# Patient Record
Sex: Male | Born: 1945 | Race: White | Hispanic: No | Marital: Married
Health system: Southern US, Community
[De-identification: ages and names within clinical notes are randomized; demographics above are authoritative.]

## PROBLEM LIST (undated history)

## (undated) DIAGNOSIS — J449 Chronic obstructive pulmonary disease, unspecified: Secondary | ICD-10-CM

## (undated) DIAGNOSIS — J95851 Ventilator associated pneumonia: Secondary | ICD-10-CM

## (undated) DIAGNOSIS — I469 Cardiac arrest, cause unspecified: Secondary | ICD-10-CM

## (undated) DIAGNOSIS — J9621 Acute and chronic respiratory failure with hypoxia: Secondary | ICD-10-CM

## (undated) DIAGNOSIS — I63512 Cerebral infarction due to unspecified occlusion or stenosis of left middle cerebral artery: Secondary | ICD-10-CM

---

## 2019-05-19 ENCOUNTER — Inpatient Hospital Stay
Admission: EM | Admit: 2019-05-19 | Discharge: 2019-07-14 | Disposition: A | Discharge status: Skilled Nursing Facility | Payer: Medicare Other | Source: Other Acute Inpatient Hospital | Attending: Internal Medicine | Admitting: Internal Medicine

## 2019-05-19 ENCOUNTER — Other Ambulatory Visit (HOSPITAL_COMMUNITY): Payer: Medicare Other

## 2019-05-19 DIAGNOSIS — J690 Pneumonitis due to inhalation of food and vomit: Secondary | ICD-10-CM

## 2019-05-19 DIAGNOSIS — R0602 Shortness of breath: Secondary | ICD-10-CM

## 2019-05-19 DIAGNOSIS — J189 Pneumonia, unspecified organism: Secondary | ICD-10-CM

## 2019-05-19 DIAGNOSIS — Z9689 Presence of other specified functional implants: Secondary | ICD-10-CM

## 2019-05-19 DIAGNOSIS — J9621 Acute and chronic respiratory failure with hypoxia: Secondary | ICD-10-CM | POA: Diagnosis present

## 2019-05-19 DIAGNOSIS — J439 Emphysema, unspecified: Secondary | ICD-10-CM

## 2019-05-19 DIAGNOSIS — J449 Chronic obstructive pulmonary disease, unspecified: Secondary | ICD-10-CM | POA: Diagnosis present

## 2019-05-19 DIAGNOSIS — J95851 Ventilator associated pneumonia: Secondary | ICD-10-CM | POA: Diagnosis present

## 2019-05-19 DIAGNOSIS — J9690 Respiratory failure, unspecified, unspecified whether with hypoxia or hypercapnia: Secondary | ICD-10-CM

## 2019-05-19 DIAGNOSIS — I63512 Cerebral infarction due to unspecified occlusion or stenosis of left middle cerebral artery: Secondary | ICD-10-CM | POA: Diagnosis present

## 2019-05-19 DIAGNOSIS — J869 Pyothorax without fistula: Secondary | ICD-10-CM

## 2019-05-19 DIAGNOSIS — J939 Pneumothorax, unspecified: Secondary | ICD-10-CM

## 2019-05-19 DIAGNOSIS — J90 Pleural effusion, not elsewhere classified: Secondary | ICD-10-CM

## 2019-05-19 DIAGNOSIS — I469 Cardiac arrest, cause unspecified: Secondary | ICD-10-CM | POA: Diagnosis present

## 2019-05-19 DIAGNOSIS — Z931 Gastrostomy status: Secondary | ICD-10-CM

## 2019-05-19 HISTORY — DX: Cardiac arrest, cause unspecified: I46.9

## 2019-05-19 HISTORY — DX: Acute and chronic respiratory failure with hypoxia: J96.21

## 2019-05-19 HISTORY — DX: Cerebral infarction due to unspecified occlusion or stenosis of left middle cerebral artery: I63.512

## 2019-05-19 HISTORY — DX: Ventilator associated pneumonia: J95.851

## 2019-05-19 HISTORY — DX: Chronic obstructive pulmonary disease, unspecified: J44.9

## 2019-05-20 ENCOUNTER — Other Ambulatory Visit (HOSPITAL_COMMUNITY): Payer: Medicare Other

## 2019-05-20 DIAGNOSIS — I63512 Cerebral infarction due to unspecified occlusion or stenosis of left middle cerebral artery: Secondary | ICD-10-CM | POA: Diagnosis not present

## 2019-05-20 DIAGNOSIS — J9621 Acute and chronic respiratory failure with hypoxia: Secondary | ICD-10-CM | POA: Diagnosis not present

## 2019-05-20 DIAGNOSIS — J95851 Ventilator associated pneumonia: Secondary | ICD-10-CM

## 2019-05-20 DIAGNOSIS — J449 Chronic obstructive pulmonary disease, unspecified: Secondary | ICD-10-CM | POA: Diagnosis not present

## 2019-05-20 DIAGNOSIS — I469 Cardiac arrest, cause unspecified: Secondary | ICD-10-CM

## 2019-05-20 LAB — BASIC METABOLIC PANEL
Anion gap: 9 (ref 5–15)
BUN: 25 mg/dL — ABNORMAL HIGH (ref 8–23)
CO2: 33 mmol/L — ABNORMAL HIGH (ref 22–32)
Calcium: 9.3 mg/dL (ref 8.9–10.3)
Chloride: 102 mmol/L (ref 98–111)
Creatinine, Ser: 0.62 mg/dL (ref 0.61–1.24)
GFR calc Af Amer: >60 mL/min (ref >60)
GFR calc non Af Amer: >60 mL/min (ref >60)
Glucose, Bld: 128 mg/dL — ABNORMAL HIGH (ref 70–99)
Potassium: 5.1 mmol/L (ref 3.5–5.1)
Sodium: 144 mmol/L (ref 135–145)

## 2019-05-20 LAB — BLOOD GAS, ARTERIAL
Acid-Base Excess: 10.4 mmol/L — ABNORMAL HIGH (ref 0.0–2.0)
Bicarbonate: 34.8 mmol/L — ABNORMAL HIGH (ref 20.0–28.0)
FIO2: 50
O2 Saturation: 97.3 %
Patient temperature: 37
pCO2 arterial: 50 mmHg — ABNORMAL HIGH (ref 32.0–48.0)
pH, Arterial: 7.457 — ABNORMAL HIGH (ref 7.350–7.450)
pO2, Arterial: 91.3 mmHg (ref 83.0–108.0)

## 2019-05-20 LAB — CBC
HCT: 27.2 % — ABNORMAL LOW (ref 39.0–52.0)
Hemoglobin: 8.2 g/dL — ABNORMAL LOW (ref 13.0–17.0)
MCH: 28.3 pg (ref 26.0–34.0)
MCHC: 30.1 g/dL (ref 30.0–36.0)
MCV: 93.8 fL (ref 80.0–100.0)
Platelets: 466 10*3/uL — ABNORMAL HIGH (ref 150–400)
RBC: 2.9 MIL/uL — ABNORMAL LOW (ref 4.22–5.81)
RDW: 16.6 % — ABNORMAL HIGH (ref 11.5–15.5)
WBC: 14.7 10*3/uL — ABNORMAL HIGH (ref 4.0–10.5)
nRBC: 0.1 % (ref 0.0–0.2)

## 2019-05-20 MED ORDER — QUETIAPINE FUMARATE 25 MG PO TABS
100.00 | ORAL_TABLET | ORAL | Status: DC
Start: 2019-05-20 — End: 2019-05-20

## 2019-05-20 MED ORDER — LORAZEPAM 2 MG/ML IJ SOLN
0.50 | INTRAMUSCULAR | Status: DC
Start: ? — End: 2019-05-20

## 2019-05-20 MED ORDER — INSULIN LISPRO 100 UNIT/ML ~~LOC~~ SOLN
1.00 | SUBCUTANEOUS | Status: DC
Start: ? — End: 2019-05-20

## 2019-05-20 MED ORDER — DEXTROSE 10 % IV SOLN
50.00 | INTRAVENOUS | Status: DC
Start: ? — End: 2019-05-20

## 2019-05-20 MED ORDER — METOCLOPRAMIDE HCL 5 MG/ML IJ SOLN
5.00 | INTRAMUSCULAR | Status: DC
Start: ? — End: 2019-05-20

## 2019-05-20 MED ORDER — IOHEXOL 300 MG/ML  SOLN
50.0000 mL | Freq: Once | INTRAMUSCULAR | Status: AC | PRN
  Administered 2019-05-20: 50 mL

## 2019-05-20 MED ORDER — GENERIC EXTERNAL MEDICATION
1.00 | Status: DC
Start: 2019-05-20 — End: 2019-05-20

## 2019-05-20 MED ORDER — METOPROLOL TARTRATE 5 MG/5ML IV SOLN
2.50 | INTRAVENOUS | Status: DC
Start: ? — End: 2019-05-20

## 2019-05-20 MED ORDER — SODIUM CHLORIDE 0.9 % IV SOLN
10.00 | INTRAVENOUS | Status: DC
Start: ? — End: 2019-05-20

## 2019-05-20 MED ORDER — ACETAMINOPHEN 160 MG/5ML PO SUSP
650.00 | ORAL | Status: DC
Start: ? — End: 2019-05-20

## 2019-05-20 MED ORDER — CALCIUM CARBONATE 1250 (500 CA) MG PO CHEW
500.00 | CHEWABLE_TABLET | ORAL | Status: DC
Start: ? — End: 2019-05-20

## 2019-05-20 MED ORDER — ONDANSETRON HCL 4 MG/2ML IJ SOLN
4.00 | INTRAMUSCULAR | Status: DC
Start: ? — End: 2019-05-20

## 2019-05-20 MED ORDER — IPRATROPIUM-ALBUTEROL 0.5-2.5 (3) MG/3ML IN SOLN
3.00 | RESPIRATORY_TRACT | Status: DC
Start: ? — End: 2019-05-20

## 2019-05-20 MED ORDER — FENTANYL CITRATE (PF) 2500 MCG/50ML IJ SOLN
50.00 | INTRAMUSCULAR | Status: DC
Start: ? — End: 2019-05-20

## 2019-05-20 MED ORDER — CLOTRIMAZOLE 1 % EX CREA
TOPICAL_CREAM | CUTANEOUS | Status: DC
Start: 2019-05-20 — End: 2019-05-20

## 2019-05-20 MED ORDER — GENERIC EXTERNAL MEDICATION
Status: DC
Start: ? — End: 2019-05-20

## 2019-05-20 MED ORDER — INSULIN GLARGINE 100 UNIT/ML ~~LOC~~ SOLN
1.00 | SUBCUTANEOUS | Status: DC
Start: 2019-05-20 — End: 2019-05-20

## 2019-05-20 MED ORDER — OXYCODONE HCL 5 MG/5ML PO SOLN
10.00 | ORAL | Status: DC
Start: 2019-05-20 — End: 2019-05-20

## 2019-05-20 MED ORDER — DIPHENOXYLATE-ATROPINE 2.5-0.025 MG/5ML PO LIQD
5.00 | ORAL | Status: DC
Start: ? — End: 2019-05-20

## 2019-05-20 MED ORDER — FENTANYL CITRATE (PF) 2500 MCG/50ML IJ SOLN
25.00 | INTRAMUSCULAR | Status: DC
Start: ? — End: 2019-05-20

## 2019-05-20 MED ORDER — QUETIAPINE FUMARATE 25 MG PO TABS
50.00 | ORAL_TABLET | ORAL | Status: DC
Start: 2019-05-20 — End: 2019-05-20

## 2019-05-20 MED ORDER — MIDODRINE HCL 5 MG PO TABS
5.00 | ORAL_TABLET | ORAL | Status: DC
Start: 2019-05-20 — End: 2019-05-20

## 2019-05-20 MED ORDER — INSULIN LISPRO 100 UNIT/ML ~~LOC~~ SOLN
1.00 | SUBCUTANEOUS | Status: DC
Start: 2019-05-20 — End: 2019-05-20

## 2019-05-20 MED ORDER — ENOXAPARIN SODIUM 80 MG/0.8ML ~~LOC~~ SOLN
80.00 | SUBCUTANEOUS | Status: DC
Start: 2019-05-20 — End: 2019-05-20

## 2019-05-20 MED ORDER — ASPIRIN 81 MG PO CHEW
81.00 | CHEWABLE_TABLET | ORAL | Status: DC
Start: 2019-05-20 — End: 2019-05-20

## 2019-05-20 NOTE — Consult Note (Signed)
Pulmonary Critical Care Medicine [facility] [place]  PULMONARY SERVICE  Date of Service: 05/20/2019  PULMONARY CRITICAL CARE CONSULT   DIOR STEPTER  MWU:[id number]  DOB: [birth year]   DOA: 05/19/2019  Referring Physician: Carron Curie, MD  HPI: Garrett Zimmerman is a 74 y.o. male seen for follow up of Acute on Chronic Respiratory Failure.  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  Patient presented to the hospital because of an acute cardiac arrest.  Patient has been having some difficulty with his breathing waking up at nighttime frequent use of his nebulizers.  The primary care physician had seen and treated for a acute exacerbation of COPD patient apparently woke up 4 AM on the day of admission he used his nebulizer machine however called told his family to call for EMS.  When EMS arrived patient was asystolic CPR was started patient was intubated and apparently in route to the ER woke up.  Hospital course was associated with diagnosis of ventilator associated pneumonia/stroke acutely the left hemisphere and right side.  Review of Systems:  ROS performed and is unremarkable other than noted above.   Past Medical History:  Diagnosis Date  . AAA (abdominal aortic aneurysm) without rupture (*) 10/10/2017  4.4 cm; stable since 10/2016  . Acute blood loss anemia 07/03/2018  . Acute encephalopathy 09/04/2017  . Acute exacerbation of chronic obstructive pulmonary disease (*) 06/11/2017  . Acute on chronic respiratory failure with hypoxia and hypercapnia (*) 06/18/2016  . Anxiety 03/10/2016  . Arthritis  . Bilateral renal cysts  . CAD (coronary artery disease) 1998  "posterior angioplasty". STENTS 2016 ALSO. DR. Mayford Knife - IN HOSPITAL 10/21/2016 - Oct 24, 2016 - HAD PASSED OUT. CARDIAC CATH DONE 10/11/16.  . Cancer (*)  . CAP (community acquired pneumonia) 03/10/2016  . Cardiac pacemaker 07/15/2018  . COPD  (chronic obstructive pulmonary disease) (*)  . COPD (chronic obstructive pulmonary disease) with emphysema (*)  SEVERE per spirometry 08/2013 (FVC 68%, FEV1 26%, FEV1/FVC 38%)  . Coronary artery disease  . Coronary artery disease involving native coronary artery of native heart without angina pectoris 06/11/2017  . Diabetes mellitus with peripheral vascular disease (*) 11/26/2017  . Dupuytren's contracture of both hands 07/31/2012  . Full dentures  . GI bleed 07/03/2018  . H/O ST elevation myocardial infarction 09/14/2014  . Heart attack (*) 1998  2016  . Hematuria  . Hyperlipidemia  . Hyperlipidemia associated with type 2 diabetes mellitus (*) 03/10/2016  . Hypertension  UNDER CONTROL PER PT. 10/16/16  . Hypertension associated with diabetes (*) 03/10/2016  . Kidney stone  PASSED X 1  . Nicotine dependence, cigarettes, with other nicotine-induced disorders 07/25/2017  . OSA (obstructive sleep apnea) 06/17/2016  Intolerant of CPAP  . Osteoarthritis of multiple joints 03/10/2016  . Oxygen dependent- 2 L/[place]  . Panic attacks  X 1  . Peripheral vascular disease (*)  . Phimosis  . Pulmonary emphysema (*) 03/10/2016  . S/P angioplasty with DESx2 to circ - 09/07/14 for STEMI at [facility] 09/14/2014  . S/P drug eluting coronary stent placement 03/10/2016  . S/P primary angioplasty with coronary stent 09/07/2014  . Sleep apnea  NO CPAP. ELEVATES HOB  . ST elevation myocardial infarction (STEMI) involving other coronary artery of inferior wall (*) 09/07/2014  . Tobacco abuse    Past Surgical History:   Past Surgical History:  Procedure Laterality Date  . Cardiac catheterization  LAST  ONE 10/11/16 - CLEAR PER PT 10/16/16  . Cholecystectomy  . [facility]  . Colonoscopy [facility]/2018  [facility]  . Coronary angioplasty with stent placement 1998  "posterior". ANOTHER IN 2016.  Marland Kitchen Other note  LEFT CAROTID ARTERY "CHECKED" FOR BLOCKAGE. - NO RESULT YET.  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 Upper  gastrointestinal endoscopy 06/2018  [facility]    Social History:   Social History   Tobacco Use  . Smoking status: Current Every Day Smoker  Packs/day: 1.00  Years: 50.00  Pack years: 50.00  Types: Cigarettes  Start date: 39  . Smokeless tobacco: Former Engineer, water Use Topics  . Alcohol use: Not Currently  . Drug use: Never    Family History:   Family History  Problem Relation Age of Onset  . Cancer Mother  . Heart disease Father  heart attack  . Cirrhosis Brother  . Alcohol abuse Brother  . Cancer Sister  Breast Cancer     Medications: Reviewed on Rounds  Physical Exam:  Vitals: Temperature 98.3 pulse 113 respiratory rate 30 blood pressure is 140/80 saturations 97%  Ventilator Settings on assist control FiO2 35% tidal volume 500 PEEP 5  . General: Comfortable at this time . Eyes: Grossly normal lids, irises & conjunctiva . ENT: grossly tongue is normal . Neck: no obvious mass . Cardiovascular: S1-S2 normal no gallop or rub . Respiratory: No rhonchi no rales are noted at this time . Abdomen: Soft and nontender . Skin: no rash seen on limited exam . Musculoskeletal: not rigid . Psychiatric:unable to assess . Neurologic: no seizure no involuntary movements         Labs on Admission:  Basic Metabolic Panel: Recent Labs  Lab 05/20/19 0516  NA 144  K 5.1  CL 102  CO2 33*  GLUCOSE 128*  BUN 25*  CREATININE 0.62  CALCIUM 9.3    Recent Labs  Lab 05/20/19 0020  PHART 7.457*  PCO2ART 50.0*  PO2ART 91.3  HCO3 34.8*  O2SAT 97.3    Liver Function Tests: No results for input(s): AST, ALT, ALKPHOS, BILITOT, PROT, ALBUMIN in the last 168 hours. No results for input(s): LIPASE, AMYLASE in the last 168 hours. No results for input(s): AMMONIA in the last 168 hours.  CBC: Recent Labs  Lab 05/20/19 0516  WBC 14.7*  HGB 8.2*  HCT 27.2*  MCV 93.8  PLT 466*    Cardiac Enzymes: No results for input(s): CKTOTAL, CKMB, CKMBINDEX, TROPONINI  in the last 168 hours.  BNP (last 3 results) No results for input(s): BNP in the last 8760 hours.  ProBNP (last 3 results) No results for input(s): PROBNP in the last 8760 hours.   Radiological Exams on Admission: DG ABDOMEN PEG TUBE LOCATION  Result Date: 05/20/2019 CLINICAL DATA:  Gastrostomy tube check EXAM: ABDOMEN - 1 VIEW COMPARISON:  None. FINDINGS: 50 mL of Omnipaque was administered through the gastrostomy tube. Contrast material outlines gastric folds. Nonobstructive bowel gas pattern. IMPRESSION: Gastrostomy tube in the stomach. Electronically Signed   By: Deatra Robinson M.D.   On: 05/20/2019 00:55    Assessment/Plan Active Problems:   Acute on chronic respiratory failure with hypoxia (HCC)   Ventilator associated pneumonia (HCC)   COPD, severe (HCC)   Left acute arterial ischemic stroke, MCA (middle cerebral artery) (HCC)   Cardiac arrest (HCC)   1. Acute on chronic respiratory failure with hypoxia spoke with respiratory therapy during rounds we will plan on doing a  RSB I and checking mechanics and try to place on pressure support per wean protocol.  Right now the ABG looked okay in fact patient was more on the alkalotic side. 2. Ventilator associated pneumonia treated patient was diagnosed with MDR E. coli x2 we will continue to follow-up radiologically. 3. Severe terminal end-stage COPD patient been on nebulizers at the other facility [facility] as needed.  We will continue with supportive care nebulizer management. 4. Acute stroke sequela patient will be continue with therapy as tolerated we will continue to monitor closely. 5. Cardiac arrest patient suffered a cardiac arrest at home and successful resuscitation now on vent with trach  I have personally seen and evaluated the patient, evaluated laboratory and imaging results, formulated the assessment and plan and placed orders. The Patient requires high complexity decision making with multiple systems involvement.  Case was  discussed on Rounds with the Respiratory Therapy Director and the Respiratory staff Time Spent 95minutes  Nicholes Hibler A Alazae Crymes, MD [facility] Pulmonary Critical Care Medicine Sleep Medicine

## 2019-05-21 ENCOUNTER — Other Ambulatory Visit (HOSPITAL_COMMUNITY): Payer: Medicare Other

## 2019-05-21 ENCOUNTER — Encounter: Payer: Self-pay | Admitting: Internal Medicine

## 2019-05-21 DIAGNOSIS — I469 Cardiac arrest, cause unspecified: Secondary | ICD-10-CM | POA: Diagnosis present

## 2019-05-21 DIAGNOSIS — I63512 Cerebral infarction due to unspecified occlusion or stenosis of left middle cerebral artery: Secondary | ICD-10-CM | POA: Diagnosis present

## 2019-05-21 DIAGNOSIS — J95851 Ventilator associated pneumonia: Secondary | ICD-10-CM | POA: Diagnosis present

## 2019-05-21 DIAGNOSIS — J9621 Acute and chronic respiratory failure with hypoxia: Secondary | ICD-10-CM | POA: Diagnosis not present

## 2019-05-21 DIAGNOSIS — J449 Chronic obstructive pulmonary disease, unspecified: Secondary | ICD-10-CM | POA: Diagnosis present

## 2019-05-21 MED ORDER — GENERIC EXTERNAL MEDICATION
Status: DC
Start: ? — End: 2019-05-21

## 2019-05-21 NOTE — Progress Notes (Signed)
Pulmonary Critical Care Medicine [place]   PULMONARY CRITICAL CARE SERVICE  PROGRESS NOTE  Date of Service: 05/21/2019  Garrett Zimmerman  MMN:[id number]  DOB: [birth year]   DOA: 05/19/2019  Referring Physician: Merton Border, MD  HPI: Garrett Zimmerman is a 74 y.o. male seen for follow up of Acute on Chronic Respiratory Failure.  Patient at this time is on full support on the ventilator on assist control mode.  Family is concerned about patient not being on nebulizers.  Patient was on as needed nebulizers at the other facility.  It appears the patient was not on any maintenance nebulizers.  And I did recommend that we proceed to start him on maintenance nebulizers.  Medications: Reviewed on Rounds  Physical Exam:  Vitals: Temperature 98.4 pulse 94 respiratory rate 35 blood pressure is 152/81 saturations 94%  Ventilator Settings on assist control FiO2 30% tidal volume 500 PEEP 5  . General: Comfortable at this time . Eyes: Grossly normal lids, irises & conjunctiva . ENT: grossly tongue is normal . Neck: no obvious mass . Cardiovascular: S1 S2 normal no gallop . Respiratory: Coarse breath sounds with few scattered rhonchi . Abdomen: soft . Skin: no rash seen on limited exam . Musculoskeletal: not rigid . Psychiatric:unable to assess . Neurologic: no seizure no involuntary movements         Lab Data:   Basic Metabolic Panel: Recent Labs  Lab 05/20/19 0516  NA 144  K 5.1  CL 102  CO2 33*  GLUCOSE 128*  BUN 25*  CREATININE 0.62  CALCIUM 9.3    ABG: Recent Labs  Lab 05/20/19 0020  PHART 7.457*  PCO2ART 50.0*  PO2ART 91.3  HCO3 34.8*  O2SAT 97.3    Liver Function Tests: No results for input(s): AST, ALT, ALKPHOS, BILITOT, PROT, ALBUMIN in the last 168 hours. No results for input(s): LIPASE, AMYLASE in the last 168 hours. No results for input(s): AMMONIA in the last 168 hours.  CBC: Recent Labs  Lab 05/20/19 0516  WBC 14.7*  HGB 8.2*   HCT 27.2*  MCV 93.8  PLT 466*    Cardiac Enzymes: No results for input(s): CKTOTAL, CKMB, CKMBINDEX, TROPONINI in the last 168 hours.  BNP (last 3 results) No results for input(s): BNP in the last 8760 hours.  ProBNP (last 3 results) No results for input(s): PROBNP in the last 8760 hours.  Radiological Exams: DG ABDOMEN PEG TUBE LOCATION  Result Date: 05/20/2019 CLINICAL DATA:  Gastrostomy tube check EXAM: ABDOMEN - 1 VIEW COMPARISON:  None. FINDINGS: 50 mL of Omnipaque was administered through the gastrostomy tube. Contrast material outlines gastric folds. Nonobstructive bowel gas pattern. IMPRESSION: Gastrostomy tube in the stomach. Electronically Signed   By: Ulyses Jarred M.D.   On: 05/20/2019 00:55   DG CHEST PORT 1 VIEW  Result Date: 05/20/2019 CLINICAL DATA:  Respiratory failure EXAM: PORTABLE CHEST 1 VIEW COMPARISON:  None. FINDINGS: There is an endotracheal tube with the tip 4.5 cm above the carina. There is mild bilateral interstitial thickening. There is opacity overlying the right lower lung which may reflect right lower lobe pneumonia versus a loculated pleural effusion. There is no pneumothorax. The heart and mediastinal contours are unremarkable. There is a dual lead cardiac pacemaker. There is no acute osseous abnormality. IMPRESSION: 1. Endotracheal tube with the tip 4.5 cm above the carina. 2. Right lower lobe pneumonia versus a loculated pleural effusion. 3. Mild pulmonary vascular congestion. Electronically Signed   By: Kathreen Devoid  On: 05/20/2019 11:22    Assessment/Plan Active Problems:   Acute on chronic respiratory failure with hypoxia (HCC)   Ventilator associated pneumonia (HCC)   COPD, severe (HCC)   Left acute arterial ischemic stroke, MCA (middle cerebral artery) (HCC)   Cardiac arrest (HCC)   1. Acute on chronic respiratory failure hypoxia plan is going to continue with full support on the ventilator.  Continue to check the RSB I.  Patient had a  chest x-ray done which shows lower lobe pneumonia versus effusion.  Consider a CT of the chest. 2. Severe COPD we will place on Pulmicort as well as formoterol nebulizers and Symbicort is not available. 3. Cardiac arrest rhythm is stable at this time we will continue to follow 4. Acute stroke therapy as tolerated   I have personally seen and evaluated the patient, evaluated laboratory and imaging results, formulated the assessment and plan and placed orders. The Patient requires high complexity decision making with multiple systems involvement.  Rounds were done with the Respiratory Therapy Director and Staff therapists and discussed with nursing staff also.  [name], MD [facility] Pulmonary Critical Care Medicine Sleep Medicine

## 2019-05-22 ENCOUNTER — Other Ambulatory Visit (HOSPITAL_COMMUNITY): Payer: Medicare Other

## 2019-05-22 DIAGNOSIS — J449 Chronic obstructive pulmonary disease, unspecified: Secondary | ICD-10-CM | POA: Diagnosis not present

## 2019-05-22 DIAGNOSIS — I469 Cardiac arrest, cause unspecified: Secondary | ICD-10-CM | POA: Diagnosis not present

## 2019-05-22 DIAGNOSIS — J9621 Acute and chronic respiratory failure with hypoxia: Secondary | ICD-10-CM | POA: Diagnosis not present

## 2019-05-22 DIAGNOSIS — I63512 Cerebral infarction due to unspecified occlusion or stenosis of left middle cerebral artery: Secondary | ICD-10-CM | POA: Diagnosis not present

## 2019-05-22 LAB — URINALYSIS, ROUTINE W REFLEX MICROSCOPIC
Bilirubin Urine: NEGATIVE
Glucose, UA: NEGATIVE mg/dL
Hgb urine dipstick: NEGATIVE
Ketones, ur: NEGATIVE mg/dL
Nitrite: NEGATIVE
Protein, ur: NEGATIVE mg/dL
Specific Gravity, Urine: 1.015 (ref 1.005–1.030)
pH: 8 (ref 5.0–8.0)

## 2019-05-22 LAB — CBC
HCT: 28.3 % — ABNORMAL LOW (ref 39.0–52.0)
Hemoglobin: 8.6 g/dL — ABNORMAL LOW (ref 13.0–17.0)
MCH: 28.3 pg (ref 26.0–34.0)
MCHC: 30.4 g/dL (ref 30.0–36.0)
MCV: 93.1 fL (ref 80.0–100.0)
Platelets: 464 10*3/uL — ABNORMAL HIGH (ref 150–400)
RBC: 3.04 MIL/uL — ABNORMAL LOW (ref 4.22–5.81)
RDW: 17.2 % — ABNORMAL HIGH (ref 11.5–15.5)
WBC: 22.2 10*3/uL — ABNORMAL HIGH (ref 4.0–10.5)
nRBC: 0 % (ref 0.0–0.2)

## 2019-05-22 LAB — BASIC METABOLIC PANEL
Anion gap: 11 (ref 5–15)
BUN: 19 mg/dL (ref 8–23)
CO2: 30 mmol/L (ref 22–32)
Calcium: 8.9 mg/dL (ref 8.9–10.3)
Chloride: 101 mmol/L (ref 98–111)
Creatinine, Ser: 0.6 mg/dL — ABNORMAL LOW (ref 0.61–1.24)
GFR calc Af Amer: >60 mL/min (ref >60)
GFR calc non Af Amer: >60 mL/min (ref >60)
Glucose, Bld: 125 mg/dL — ABNORMAL HIGH (ref 70–99)
Potassium: 4.1 mmol/L (ref 3.5–5.1)
Sodium: 142 mmol/L (ref 135–145)

## 2019-05-22 MED ORDER — GENERIC EXTERNAL MEDICATION
Status: DC
Start: ? — End: 2019-05-22

## 2019-05-22 MED ORDER — IOHEXOL 300 MG/ML  SOLN
75.0000 mL | Freq: Once | INTRAMUSCULAR | Status: AC | PRN
  Administered 2019-05-22: 17:00:00 75 mL via INTRAVENOUS

## 2019-05-22 NOTE — Consult Note (Signed)
Infectious Disease Consultation   Garrett Zimmerman  IHK:[id number]  DOB: [birth year]  DOA: 05/19/2019  PCP: Patient, No Pcp Per   Requesting physician: Dr.Brown  Reason for consultation: Antibiotic recommendations   History of Present Illness: Garrett Zimmerman is an 74 y.o. male with history of stroke, anxiety, COPD, coronary artery disease, abdominal aortic aneurysm, hypertension, diabetes mellitus.  He was initially brought to the hospital after having cardiac arrest.  He was admitted to [facility].  Prior to his cardiac arrest he was having worsening shortness of breath despite using his nebulizer machine.  He saw his primary care physician outpatient and was given a prescription for Levaquin and prednisone.  On the day of presentation to the acute facility he woke up at 4 AM used his nebulizer machine and called his family for help.  When EMS arrived he was asystolic.  CPR was initiated.  He was intubated in the field.  Per records patient reportedly was more awake upon arrival to the ER and was noted to have unilateral weakness.  Code stroke was called.  Imaging showed a large right-sided infarct with petechial hemorrhage.  Neurology was consulted and his anticoagulation was held.  He was unable to be weaned off the vent and eventually had trach and PEG placed on April 15, 2019.  During his hospital stay he was found to have right upper extremity DVT and was started on heparin drip and later transitioned to low molecular weight heparin.  He also had worsening fevers and leukocytosis.  Cultures showed E. coli pneumonia which was ESBL.  He was started on meropenem and vancomycin.  He was later narrowed down to ertapenem.  Due to his complex medical problems he was transferred to [facility].   Review of Systems:  He has a trach, on vent.  Nonverbal, unresponsive.  Unable to obtain review of systems at this time.  Past Medical History: AAA (abdominal aortic  aneurysm) without rupture (*) 10/10/2017  4.4 cm; stable since 10/2016  . Acute blood loss anemia 07/03/2018  . Acute encephalopathy 09/04/2017  . Acute exacerbation of chronic obstructive pulmonary disease (*) 06/11/2017  . Acute on chronic respiratory failure with hypoxia and hypercapnia (*) 06/18/2016  . Anxiety 03/10/2016  . Arthritis  . Bilateral renal cysts  . CAD (coronary artery disease) 1998  "posterior angioplasty". STENTS 2016 ALSO. DR. Mayford Knife - IN HOSPITAL November 04, 2016 - 2016-11-07 - HAD PASSED OUT. CARDIAC CATH DONE 10/11/16.  . Cancer (*)  . CAP (community acquired pneumonia) 03/10/2016  . Cardiac pacemaker 07/15/2018  . COPD (chronic obstructive pulmonary disease) with emphysema (*)  SEVERE per spirometry 08/2013 (FVC 68%, FEV1 26%, FEV1/FVC 38%)  . Coronary artery disease  . Coronary artery disease involving native coronary artery of native heart without angina pectoris 06/11/2017  . Diabetes mellitus with peripheral vascular disease (*) 11/26/2017  . Dupuytren's contracture of both hands 07/31/2012  . Full dentures  . GI bleed 07/03/2018  . H/O ST elevation myocardial infarction 09/14/2014  . Heart attack (*) 1998  2016  . Hematuria  . Hyperlipidemia  . Hyperlipidemia associated with type 2 diabetes mellitus (*) 03/10/2016  . Hypertension  UNDER CONTROL PER PT. 10/16/16  . Hypertension associated with diabetes (*) 03/10/2016  . Kidney stone  PASSED X 1  . Nicotine dependence, cigarettes, with other nicotine-induced disorders 07/25/2017  . OSA (obstructive sleep apnea) 06/17/2016  Intolerant of CPAP  . Osteoarthritis of  multiple joints 03/10/2016  . Oxygen dependent- 2 L/Edie  . Panic attacks  X 1  . Peripheral vascular disease (*)  . Phimosis  . Pulmonary emphysema (*) 03/10/2016  . S/P angioplasty with DESx2 to circ - 09/07/14 for STEMI at [facility] 09/14/2014  . S/P drug eluting coronary stent placement 03/10/2016  . S/P primary angioplasty with coronary stent 09/07/2014  . Sleep apnea  NO CPAP. ELEVATES  HOB  . ST elevation myocardial infarction (STEMI) involving other coronary artery of inferior wall (*) 09/07/2014    Past Surgical History: . Cardiac catheterization  LAST ONE 10/11/16 - CLEAR PER PT 10/16/16  . Cholecystectomy  . [facility]  . [facility]oscopy 06/2018  [facility]  . Coronary angioplasty with stent placement 1998  "posterior". ANOTHER IN 2016.  Marland Kitchen Other note  LEFT CAROTID ARTERY "CHECKED" FOR BLOCKAGE. - NO RESULT YET.  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 Upper gastrointestinal endoscopy 06/2018    Allergies: Crestor, sulfa antibiotics, Spiriva Respimat  Social History: Smoking status: Current Every Day Smoker  Packs/day: 1.00  Years: 50.00  Pack years: 50.00  Types: Cigarettes  Start date: 38  . Smokeless tobacco: Former Engineer, water and Sexual Activity  . Alcohol use: Not Currently  . Drug use: Never    Family History: Cancer Mother  . Heart disease Father  heart attack  . Cirrhosis Brother  . Alcohol abuse Brother  . Cancer Sister    Physical Exam: Vitals: Temperature 99.4, pulse 106, respiratory 28, blood pressure 125/67, oxygen saturation 97% on 50% FiO2 and PEEP of 5. Constitutional: Ill-appearing male, not following any commands at this time Head: Atraumatic, normocephalic Eyes: PERLA, irises appear normal, anicteric sclera,  ENMT: external ears and nose appear normal, Lips appears normal, edentulous, oropharynx mucosa, tongue, normal  Neck: Has trach in place CVS: S1-S2, no murmur Respiratory: Rhonchi, no wheezing Abdomen: Obese, soft, positive bowel sounds Musculoskeletal: No lower extremity edema, has upper extremity edema Neuro: He is not following any commands at this time.  Unable to assess. Psych: Unable to assess at this time Skin: No new rashes  Data reviewed:  I have personally reviewed following labs and imaging studies Labs:  CBC: Recent Labs  Lab 05/20/19 0516 05/22/19 0732  WBC 14.7* 22.2*  HGB 8.2*  8.6*  HCT 27.2* 28.3*  MCV 93.8 93.1  PLT 466* 464*    Basic Metabolic Panel: Recent Labs  Lab 05/20/19 0516 05/22/19 0732  NA 144 142  K 5.1 4.1  CL 102 101  CO2 33* 30  GLUCOSE 128* 125*  BUN 25* 19  CREATININE 0.62 0.60*  CALCIUM 9.3 8.9   GFR CrCl cannot be calculated (Unknown ideal weight.). Liver Function Tests: No results for input(s): AST, ALT, ALKPHOS, BILITOT, PROT, ALBUMIN in the last 168 hours. No results for input(s): LIPASE, AMYLASE in the last 168 hours. No results for input(s): AMMONIA in the last 168 hours. Coagulation profile No results for input(s): INR, PROTIME in the last 168 hours.  Cardiac Enzymes: No results for input(s): CKTOTAL, CKMB, CKMBINDEX, TROPONINI in the last 168 hours. BNP: Invalid input(s): POCBNP CBG: No results for input(s): GLUCAP in the last 168 hours. D-Dimer No results for input(s): DDIMER in the last 72 hours. Hgb A1c No results for input(s): HGBA1C in the last 72 hours. Lipid Profile No results for input(s): CHOL, HDL, LDLCALC, TRIG, CHOLHDL, LDLDIRECT in the last 72 hours. Thyroid function studies No results for input(s): TSH, T4TOTAL, T3FREE,  THYROIDAB in the last 72 hours.  Invalid input(s): FREET3 Anemia work up No results for input(s): VITAMINB12, FOLATE, FERRITIN, TIBC, IRON, RETICCTPCT in the last 72 hours. Urinalysis    Component Value Date/Time   COLORURINE YELLOW 05/22/2019 0629   APPEARANCEUR CLOUDY (A) 05/22/2019 0629   LABSPEC 1.015 05/22/2019 0629   PHURINE 8.0 05/22/2019 0629   GLUCOSEU NEGATIVE 05/22/2019 0629   HGBUR NEGATIVE 05/22/2019 0629   BILIRUBINUR NEGATIVE 05/22/2019 0629   KETONESUR NEGATIVE 05/22/2019 0629   PROTEINUR NEGATIVE 05/22/2019 0629   NITRITE NEGATIVE 05/22/2019 0629   LEUKOCYTESUR SMALL (A) 05/22/2019 0629     Microbiology No results found for this or any previous visit (from the past 240 hour(s)).   Inpatient Medications:   Please see MAR   Radiological Exams on  Admission: DG Chest Port 1 View  Result Date: 05/21/2019 CLINICAL DATA:  Pneumonia EXAM: PORTABLE CHEST 1 VIEW COMPARISON:  05/20/2019 FINDINGS: Cardiac shadow is stable. Pacing device is again seen. Persistent infiltrate is noted in the right base similar to that noted on the prior exam. A portion of this may represent some loculated fluid as well. No bony abnormality is seen. Tracheostomy tube is noted in satisfactory position. IMPRESSION: Stable opacity in the right base likely representing a combination of infiltrate and loculated fluid. Electronically Signed   By: Inez Catalina M.D.   On: 05/21/2019 19:22    Impression/Recommendations Active Problems:   Acute on chronic respiratory failure with hypoxia (HCC)   Ventilator associated pneumonia (HCC)   COPD, severe (HCC)   Left acute arterial ischemic stroke, MCA (middle cerebral artery) ([place])   Cardiac arrest ([place]) Fever/leukocytosis Recent pneumonia with ESBL E. coli E. coli bacteremia Right upper extremity DVT Diabetes mellitus type 2 Dysphagia  Acute on chronic hypoxemic respiratory failure: Patient continues to be vent dependent on 50% FiO2, PEEP of 5.  Likely multifactorial etiology.  He has severe COPD.  He also has ventilator associated pneumonia.  At the outside facility he had pneumonia with respiratory cultures that showed ESBL E. coli.  He was treated with meropenem but later deescalated to Invanz.  However, now having fevers again with worsening leukocytosis.  Therefore switched to IV vancomycin, meropenem.  Do not think he needs a fluconazole at this time.  Chest x-ray showing right-sided infiltrate.  CT chest ordered by the primary team.  Agree with the CT chest to get better evaluation.  Pulmonary also following.  Unfortunately has dysphagia and also high risk for aspiration given the recent stroke which places him at a very high risk for worsening respiratory failure, recurrent pneumonia secondary to aspiration despite being on  antibiotics.  Respiratory cultures also ordered.  We will plan to treat for duration of 1 week pending improvement.  Follow-up on the respiratory cultures and adjust antibiotics accordingly.  Pneumonia: As mentioned above cultures at the outside facility showed ESBL E. coli.  Was on Invanz but now having fevers with worsening leukocytosis.  Also because of his dysphagia high suspicion for ongoing aspiration.  Recommend to switch to IV vancomycin, meropenem.  Follow-up on the respiratory cultures and adjust antibiotics accordingly.  Chest CT being ordered as per the primary team.  Pulmonary also following.  E. coli bacteremia: He was treated with antibiotics already at the outside facility.  Per reports had negative blood cultures.  However, if he starts having fevers with temperature greater than 101 would recommend to send for repeat pancultures.  Currently on treatment with IV vancomycin, meropenem.  Fever/leukocytosis:  Likely secondary to the pneumonia.  Antibiotics and plan as mentioned above.  Again, as mentioned above he has dysphagia and high suspicion for ongoing aspiration which unfortunately places him at very high risk for recurrent and worsening pneumonia, worsening fever or worsening leukocytosis despite being on antibiotics.  Continue to monitor counts.  Left acute arterial ischemic stroke: Continue risk factor modification, medications per the primary team.  He is also status post recent cardiac arrest.  Continue medications per primary team.  Right upper extremity DVT: Continue management per the primary team.  Diabetes mellitus type 2: Continue to monitor Accu-Cheks, medications for diabetes per the primary team.  Dysphagia: Unfortunately due to his dysphagia he is high risk for aspiration and worsening respiratory secondary to aspiration pneumonia.  Further management per primary team.  Unfortunately due to his multiple complex medical problems he is very high risk for worsening and  decompensation.  Thank you for involving [place] in the care of this patient. Above document was dictated using a voice recognition device.    Thank you for this consultation.    Vonzella Nipple M.D. 05/22/2019, 2:06 PM

## 2019-05-22 NOTE — Progress Notes (Signed)
Pulmonary Critical Care Medicine [place]   PULMONARY CRITICAL CARE SERVICE  PROGRESS NOTE  Date of Service: 05/22/2019  Garrett Zimmerman  ZTI:[id number]  DOB: 06/08/1945   DOA: 05/19/2019  Referring Physician: Merton Border, MD  HPI: Garrett Zimmerman is a 74 y.o. male seen for follow up of Acute on Chronic Respiratory Failure.  Patient currently is on assist control mode has possibly aspirated and currently is requiring 50% FiO2 also patient had a low-grade fever noted.  Medications: Reviewed on Rounds  Physical Exam:  Vitals: Temperature is 99.4 pulse 106 respiratory rate 28 blood pressure is 125/67 saturations 97%  Ventilator Settings on assist control FiO2 50% tidal volume 497 PEEP five  . General: Comfortable at this time . Eyes: Grossly normal lids, irises & conjunctiva . ENT: grossly tongue is normal . Neck: no obvious mass . Cardiovascular: S1 S2 normal no gallop . Respiratory: No rhonchi coarse breath sounds . Abdomen: soft . Skin: no rash seen on limited exam . Musculoskeletal: not rigid . Psychiatric:unable to assess . Neurologic: no seizure no involuntary movements         Lab Data:   Basic Metabolic Panel: Recent Labs  Lab 05/20/19 0516 05/22/19 0732  NA 144 142  K 5.1 4.1  CL 102 101  CO2 33* 30  GLUCOSE 128* 125*  BUN 25* 19  CREATININE 0.62 0.60*  CALCIUM 9.3 8.9    ABG: Recent Labs  Lab 05/20/19 0020  PHART 7.457*  PCO2ART 50.0*  PO2ART 91.3  HCO3 34.8*  O2SAT 97.3    Liver Function Tests: No results for input(s): AST, ALT, ALKPHOS, BILITOT, PROT, ALBUMIN in the last 168 hours. No results for input(s): LIPASE, AMYLASE in the last 168 hours. No results for input(s): AMMONIA in the last 168 hours.  CBC: Recent Labs  Lab 05/20/19 0516 05/22/19 0732  WBC 14.7* 22.2*  HGB 8.2* 8.6*  HCT 27.2* 28.3*  MCV 93.8 93.1  PLT 466* 464*    Cardiac Enzymes: No results for input(s): CKTOTAL, CKMB, CKMBINDEX, TROPONINI  in the last 168 hours.  BNP (last 3 results) No results for input(s): BNP in the last 8760 hours.  ProBNP (last 3 results) No results for input(s): PROBNP in the last 8760 hours.  Radiological Exams: DG Chest Port 1 View  Result Date: 05/21/2019 CLINICAL DATA:  Pneumonia EXAM: PORTABLE CHEST 1 VIEW COMPARISON:  05/20/2019 FINDINGS: Cardiac shadow is stable. Pacing device is again seen. Persistent infiltrate is noted in the right base similar to that noted on the prior exam. A portion of this may represent some loculated fluid as well. No bony abnormality is seen. Tracheostomy tube is noted in satisfactory position. IMPRESSION: Stable opacity in the right base likely representing a combination of infiltrate and loculated fluid. Electronically Signed   By: Inez Catalina M.D.   On: 05/21/2019 19:22    Assessment/Plan Active Problems:   Acute on chronic respiratory failure with hypoxia (HCC)   Ventilator associated pneumonia (HCC)   COPD, severe (HCC)   Left acute arterial ischemic stroke, MCA (middle cerebral artery) ([place])   Cardiac arrest ([place])   1. Acute on chronic respiratory failure hypoxia plan is to continue with full support on the ventilator saturations are acceptable at 97% try to wean FiO2 down.  Chest x-ray was done which shows still opacity present in the base.  Now with possible aspiration antibiotics will be discussed with the primary care team. 2. Ventilator associated pneumonia with possible aspiration pneumonia  will need to be treated we will continue supportive care. 3. Severe COPD at baseline nebulizers as necessary 4. Acute stroke no change continue with present management 5. Cardiac arrest right now rhythm is stable we will continue to follow along with   I have personally seen and evaluated the patient, evaluated laboratory and imaging results, formulated the assessment and plan and placed orders. The Patient requires high complexity decision making with multiple  systems involvement.  Rounds were done with the Respiratory Therapy Director and Staff therapists and discussed with nursing staff also.  [name], MD [facility] Pulmonary Critical Care Medicine Sleep Medicine

## 2019-05-23 ENCOUNTER — Other Ambulatory Visit (HOSPITAL_COMMUNITY): Payer: Medicare Other

## 2019-05-23 DIAGNOSIS — I469 Cardiac arrest, cause unspecified: Secondary | ICD-10-CM | POA: Diagnosis not present

## 2019-05-23 DIAGNOSIS — J9621 Acute and chronic respiratory failure with hypoxia: Secondary | ICD-10-CM | POA: Diagnosis not present

## 2019-05-23 DIAGNOSIS — J449 Chronic obstructive pulmonary disease, unspecified: Secondary | ICD-10-CM | POA: Diagnosis not present

## 2019-05-23 DIAGNOSIS — I63512 Cerebral infarction due to unspecified occlusion or stenosis of left middle cerebral artery: Secondary | ICD-10-CM | POA: Diagnosis not present

## 2019-05-23 LAB — URINE CULTURE: Culture: >=100000 — AB

## 2019-05-23 MED ORDER — FENTANYL CITRATE (PF) 100 MCG/2ML IJ SOLN
INTRAMUSCULAR | Status: AC
  Filled 2019-05-23: qty 2

## 2019-05-23 MED ORDER — MIDAZOLAM HCL 2 MG/2ML IJ SOLN
INTRAMUSCULAR | Status: AC
  Filled 2019-05-23: qty 2

## 2019-05-23 MED ORDER — GENERIC EXTERNAL MEDICATION
Status: DC
Start: ? — End: 2019-05-23

## 2019-05-23 NOTE — Sedation Documentation (Signed)
This case was no sedation needed. MD used local. Respiratory from [facility] remained with patient to monitor due to patient being vented. Chest tube placed, patient's vitals remained stable. No signs of distress. Patient will return to [facility].

## 2019-05-23 NOTE — Procedures (Signed)
Interventional Radiology Procedure Note  Procedure: CT guided right chest drain, with 11F drain into empyema.  Complications: None  Recommendations:  - follow up culture - Do not submerge - Routine care  - chest drain care  Signed,  Yvone Neu. Loreta Ave, DO

## 2019-05-23 NOTE — H&P (Signed)
Chief Complaint: Empyema  Referring Physician(s): Luna Kitchens  Supervising Physician: Gilmer Mor  Patient Status: [facility] - In-pt  History of Present Illness: Garrett Zimmerman is a 74 y.o. male with acute on chronic hypoxemic respiratory failure who is vent dependent on 50% FiO2, PEEP of 5.    Likely multifactorial etiology.    He has severe COPD.    He also has ventilator associated pneumonia.    At the outside facility he had pneumonia with respiratory cultures that showed ESBL E. coli.    He was treated with meropenem but later deescalated to Invanz.    He started having fevers again yesterday with worsening leukocytosis so CT scan was obtained.  CT showed = Bibasilar consolidation with evidence of right-sided loculated fluid collection with air within. This likely represents a focal empyema.  We are asked to evaluate for image guided pigtail chest tube placement.  Past Medical History:  Diagnosis Date  . Acute on chronic respiratory failure with hypoxia (HCC)   . Cardiac arrest (HCC)   . COPD, severe (HCC)   . Left acute arterial ischemic stroke, MCA (middle cerebral artery) (HCC)   . Ventilator associated pneumonia (HCC)       Allergies: Patient has no allergy information on record.  Medications: Prior to Admission medications   Not on File     No family history on file.  Social History   Socioeconomic History  . Marital status: Married    Spouse name: Not on file  . Number of children: Not on file  . Years of education: Not on file  . Highest education level: Not on file  Occupational History  . Not on file  Tobacco Use  . Smoking status: Not on file  Substance and Sexual Activity  . Alcohol use: Not on file  . Drug use: Not on file  . Sexual activity: Not on file  Other Topics Concern  . Not on file  Social History Narrative  . Not on file   Social Determinants of Health   Financial Resource Strain:   . Difficulty of Paying  Living Expenses:   Food Insecurity:   . Worried About Programme researcher, broadcasting/film/video in the Last Year:   . Barista in the Last Year:   Transportation Needs:   . Freight forwarder (Medical):   Marland Kitchen Lack of Transportation (Non-Medical):   Physical Activity:   . Days of Exercise per Week:   . Minutes of Exercise per Session:   Stress:   . Feeling of Stress :   Social Connections:   . Frequency of Communication with Friends and Family:   . Frequency of Social Gatherings with Friends and Family:   . Attends Religious Services:   . Active Member of Clubs or Organizations:   . Attends Banker Meetings:   Marland Kitchen Marital Status:     Review of Systems  Unable to perform ROS: Mental status change      Physical Exam Constitutional:      Appearance: He is ill-appearing.  HENT:     Head: Normocephalic and atraumatic.  Cardiovascular:     Rate and Rhythm: Normal rate and regular rhythm.  Pulmonary:     Breath sounds: Rhonchi present.     Comments: Trach/vent Abdominal:     Palpations: Abdomen is soft.  Musculoskeletal:     Cervical back: Normal range of motion.  Skin:    General: Skin is warm and dry.  Neurological:     Comments: Unarousable     Imaging: DG Abd 1 View  Result Date: 05/22/2019 CLINICAL DATA:  Gastroesophageal reflux EXAM: ABDOMEN - 1 VIEW COMPARISON:  May 19, 2019 FINDINGS: Gastrostomy catheter is positioned in the stomach. There is contrast in the colon as well as in the urinary bladder. There is no bowel dilatation or air-fluid level to suggest bowel obstruction. No free air. Lung bases are clear. Pacemaker lead tips are attached to the right atrium and right ventricle. There are surgical clips in the right upper abdomen. There is aortic atherosclerosis. IMPRESSION: Gastrostomy catheter positioned in stomach region. No bowel obstruction or free air. Contrast in colon. Surgical clips right upper quadrant. Aortic Atherosclerosis (ICD10-I70.0).  Electronically Signed   By: Bretta Bang III M.D.   On: 05/22/2019 19:15   CT CHEST W CONTRAST  Result Date: 05/22/2019 CLINICAL DATA:  Elevated white blood cell count EXAM: CT CHEST WITH CONTRAST TECHNIQUE: Multidetector CT imaging of the chest was performed during intravenous contrast administration. CONTRAST:  34mL OMNIPAQUE IOHEXOL 300 MG/ML  SOLN COMPARISON:  Chest x-ray from the previous day. FINDINGS: Cardiovascular: Thoracic aorta and its branches demonstrate atherosclerotic calcifications without aneurysmal dilatation or dissection. No cardiac enlargement is seen. Pacing device is noted. Heavy coronary calcifications are noted. The pulmonary artery shows no large central pulmonary embolus although timing was not performed for embolus evaluation. Mediastinum/Nodes: Thoracic inlet demonstrates evidence of a tracheostomy tube in satisfactory position. No sizable hilar or mediastinal adenopathy is noted. Scattered small lymph nodes are seen with normal fatty hila. The esophagus is within normal limits. Lungs/Pleura: Mild emphysematous changes are noted. The left lung is well aerated with the exception of mild lower lobe patchy infiltrate. No sizable effusion is noted. There is a loculated empyema identified with air within in the posterior aspect of the right chest wall. Some adjacent atelectasis/infiltrate is noted as well. No sizable parenchymal nodules are noted. Upper Abdomen: There are several rounded hyperdense lesions within the spleen the largest of which is noted on image number 128 of series 3. It measures 2 cm in greatest dimension. These likely represent hemangiomas. Fatty infiltration of the liver is noted. No other focal abnormality is seen. Musculoskeletal: Multiple old rib fractures are noted bilaterally with degrees of healing. No acute fracture is noted. No compression deformity is seen. IMPRESSION: Bibasilar consolidation with evidence of right-sided loculated fluid collection with  air within. This likely represents a focal empyema. Hyperdense lesions within the spleen likely representing hemangiomas. Aortic Atherosclerosis (ICD10-I70.0) and Emphysema (ICD10-J43.9). Electronically Signed   By: Alcide Clever M.D.   On: 05/22/2019 17:22   DG ABDOMEN PEG TUBE LOCATION  Result Date: 05/20/2019 CLINICAL DATA:  Gastrostomy tube check EXAM: ABDOMEN - 1 VIEW COMPARISON:  None. FINDINGS: 50 mL of Omnipaque was administered through the gastrostomy tube. Contrast material outlines gastric folds. Nonobstructive bowel gas pattern. IMPRESSION: Gastrostomy tube in the stomach. Electronically Signed   By: Deatra Robinson M.D.   On: 05/20/2019 00:55   DG Chest Port 1 View  Result Date: 05/21/2019 CLINICAL DATA:  Pneumonia EXAM: PORTABLE CHEST 1 VIEW COMPARISON:  05/20/2019 FINDINGS: Cardiac shadow is stable. Pacing device is again seen. Persistent infiltrate is noted in the right base similar to that noted on the prior exam. A portion of this may represent some loculated fluid as well. No bony abnormality is seen. Tracheostomy tube is noted in satisfactory position. IMPRESSION: Stable opacity in the right base likely representing a combination of infiltrate  and loculated fluid. Electronically Signed   By: Inez Catalina M.D.   On: 05/21/2019 19:22   DG CHEST PORT 1 VIEW  Result Date: 05/20/2019 CLINICAL DATA:  Respiratory failure EXAM: PORTABLE CHEST 1 VIEW COMPARISON:  None. FINDINGS: There is an endotracheal tube with the tip 4.5 cm above the carina. There is mild bilateral interstitial thickening. There is opacity overlying the right lower lung which may reflect right lower lobe pneumonia versus a loculated pleural effusion. There is no pneumothorax. The heart and mediastinal contours are unremarkable. There is a dual lead cardiac pacemaker. There is no acute osseous abnormality. IMPRESSION: 1. Endotracheal tube with the tip 4.5 cm above the carina. 2. Right lower lobe pneumonia versus a loculated  pleural effusion. 3. Mild pulmonary vascular congestion. Electronically Signed   By: Kathreen Devoid   On: 05/20/2019 11:22    Labs:  CBC: Recent Labs    05/20/19 0516 05/22/19 0732  WBC 14.7* 22.2*  HGB 8.2* 8.6*  HCT 27.2* 28.3*  PLT 466* 464*    COAGS: No results for input(s): INR, APTT in the last 8760 hours.  BMP: Recent Labs    05/20/19 0516 05/22/19 0732  NA 144 142  K 5.1 4.1  CL 102 101  CO2 33* 30  GLUCOSE 128* 125*  BUN 25* 19  CALCIUM 9.3 8.9  CREATININE 0.62 0.60*  GFRNONAA >60 >60  GFRAA >60 >60    LIVER FUNCTION TESTS: No results for input(s): BILITOT, AST, ALT, ALKPHOS, PROT, ALBUMIN in the last 8760 hours.  TUMOR MARKERS: No results for input(s): AFPTM, CEA, CA199, CHROMGRNA in the last 8760 hours.  Assessment and Plan:  Bibasilar consolidation with evidence of right-sided loculated fluid collection with air within. This likely represents a focal empyema.  Will proceed with CT guided placement of a pigtail chest tube today by Dr. Earleen Newport.  Risks and benefits discussed with the patient's daughter including bleeding, infection, damage to adjacent structures, and sepsis.  All of the patient's daughters questions were answered, patient is agreeable to proceed. Consent signed and in chart.  Thank you for this interesting consult.  I greatly enjoyed meeting OMARRI EICH and look forward to participating in their care.  A copy of this report was sent to the requesting provider on this date.  Electronically Signed: Murrell Redden, PA-C   05/23/2019, 10:22 AM      I spent a total of 20 Minutes in face to face in clinical consultation, greater than 50% of which was counseling/coordinating care for pigtail chest tube.

## 2019-05-23 NOTE — Progress Notes (Addendum)
Pulmonary Critical Care Medicine [place]   PULMONARY CRITICAL CARE SERVICE  PROGRESS NOTE  Date of Service: 05/23/2019  TADARRIUS Zimmerman  WUJ:[id number]  DOB: 07/14/1945   DOA: 05/19/2019  Referring Physician: Merton Border, MD  HPI: Garrett Zimmerman is a 74 y.o. male seen for follow up of Acute on Chronic Respiratory Failure.  Patient continues on full support on the ventilator unable to wean at this time currently requiring 45% FiO2.  Medications: Reviewed on Rounds  Physical Exam:  Vitals: Pulse 101 respirations 30 BP 133/76 O2 sat 96% temp 99.6  Ventilator Settings ventilator mode AC VC rate of 14 tidal volume 500 PEEP 5 FiO2 45%  . General: Comfortable at this time . Eyes: Grossly normal lids, irises & conjunctiva . ENT: grossly tongue is normal . Neck: no obvious mass . Cardiovascular: S1 S2 normal no gallop . Respiratory: No rales or rhonchi noted . Abdomen: soft . Skin: no rash seen on limited exam . Musculoskeletal: not rigid . Psychiatric:unable to assess . Neurologic: no seizure no involuntary movements         Lab Data:   Basic Metabolic Panel: Recent Labs  Lab 05/20/19 0516 05/22/19 0732  NA 144 142  K 5.1 4.1  CL 102 101  CO2 33* 30  GLUCOSE 128* 125*  BUN 25* 19  CREATININE 0.62 0.60*  CALCIUM 9.3 8.9    ABG: Recent Labs  Lab 05/20/19 0020  PHART 7.457*  PCO2ART 50.0*  PO2ART 91.3  HCO3 34.8*  O2SAT 97.3    Liver Function Tests: No results for input(s): AST, ALT, ALKPHOS, BILITOT, PROT, ALBUMIN in the last 168 hours. No results for input(s): LIPASE, AMYLASE in the last 168 hours. No results for input(s): AMMONIA in the last 168 hours.  CBC: Recent Labs  Lab 05/20/19 0516 05/22/19 0732  WBC 14.7* 22.2*  HGB 8.2* 8.6*  HCT 27.2* 28.3*  MCV 93.8 93.1  PLT 466* 464*    Cardiac Enzymes: No results for input(s): CKTOTAL, CKMB, CKMBINDEX, TROPONINI in the last 168 hours.  BNP (last 3 results) No results for  input(s): BNP in the last 8760 hours.  ProBNP (last 3 results) No results for input(s): PROBNP in the last 8760 hours.  Radiological Exams: DG Abd 1 View  Result Date: 05/22/2019 CLINICAL DATA:  Gastroesophageal reflux EXAM: ABDOMEN - 1 VIEW COMPARISON:  May 19, 2019 FINDINGS: Gastrostomy catheter is positioned in the stomach. There is contrast in the colon as well as in the urinary bladder. There is no bowel dilatation or air-fluid level to suggest bowel obstruction. No free air. Lung bases are clear. Pacemaker lead tips are attached to the right atrium and right ventricle. There are surgical clips in the right upper abdomen. There is aortic atherosclerosis. IMPRESSION: Gastrostomy catheter positioned in stomach region. No bowel obstruction or free air. Contrast in colon. Surgical clips right upper quadrant. Aortic Atherosclerosis (ICD10-I70.0). Electronically Signed   By: Lowella Grip III M.D.   On: 05/22/2019 19:15   CT CHEST W CONTRAST  Result Date: 05/22/2019 CLINICAL DATA:  Elevated white blood cell count EXAM: CT CHEST WITH CONTRAST TECHNIQUE: Multidetector CT imaging of the chest was performed during intravenous contrast administration. CONTRAST:  31mL OMNIPAQUE IOHEXOL 300 MG/ML  SOLN COMPARISON:  Chest x-ray from the previous day. FINDINGS: Cardiovascular: Thoracic aorta and its branches demonstrate atherosclerotic calcifications without aneurysmal dilatation or dissection. No cardiac enlargement is seen. Pacing device is noted. Heavy coronary calcifications are noted. The pulmonary artery  shows no large central pulmonary embolus although timing was not performed for embolus evaluation. Mediastinum/Nodes: Thoracic inlet demonstrates evidence of a tracheostomy tube in satisfactory position. No sizable hilar or mediastinal adenopathy is noted. Scattered small lymph nodes are seen with normal fatty hila. The esophagus is within normal limits. Lungs/Pleura: Mild emphysematous changes are  noted. The left lung is well aerated with the exception of mild lower lobe patchy infiltrate. No sizable effusion is noted. There is a loculated empyema identified with air within in the posterior aspect of the right chest wall. Some adjacent atelectasis/infiltrate is noted as well. No sizable parenchymal nodules are noted. Upper Abdomen: There are several rounded hyperdense lesions within the spleen the largest of which is noted on image number 128 of series 3. It measures 2 cm in greatest dimension. These likely represent hemangiomas. Fatty infiltration of the liver is noted. No other focal abnormality is seen. Musculoskeletal: Multiple old rib fractures are noted bilaterally with degrees of healing. No acute fracture is noted. No compression deformity is seen. IMPRESSION: Bibasilar consolidation with evidence of right-sided loculated fluid collection with air within. This likely represents a focal empyema. Hyperdense lesions within the spleen likely representing hemangiomas. Aortic Atherosclerosis (ICD10-I70.0) and Emphysema (ICD10-J43.9). Electronically Signed   By: Alcide Clever M.D.   On: 05/22/2019 17:22   CT IMAGE GUIDED DRAINAGE BY PERCUTANEOUS CATHETER  Result Date: 05/23/2019 INDICATION: 74 year old male with a history of empyema. EXAM: CT GUIDED DRAINAGE OF RIGHT CHEST ABSCESS MEDICATIONS: The patient is currently admitted to the hospital and receiving intravenous antibiotics. The antibiotics were administered within an appropriate time frame prior to the initiation of the procedure. ANESTHESIA/SEDATION: No sedation COMPLICATIONS: None TECHNIQUE: Informed written consent was obtained from the patient after a thorough discussion of the procedural risks, benefits and alternatives. All questions were addressed. Maximal Sterile Barrier Technique was utilized including caps, mask, sterile gowns, sterile gloves, sterile drape, hand hygiene and skin antiseptic. A timeout was performed prior to the initiation  of the procedure. PROCEDURE: Patient positioned in the right decubitus position on the CT gantry table. Scout CT was acquired. The operative field was prepped with Chlorhexidine in a sterile fashion, and a sterile drape was applied covering the operative field. A sterile gown and sterile gloves were used for the procedure. Local anesthesia was provided with 1% Lidocaine. 1% lidocaine was used for local anesthesia. Small stab incision was made with 11 blade scalpel. Using CT guidance, trocar needle was advanced into the empyema of the right chest. Once we confirmed position, modified Seldinger technique was used to place a 12 [place] drain. Drain was sutured in position and attached to pleura vac. Sample was sent for culture. Final CT was acquired. Patient tolerated the procedure well and remained hemodynamically stable throughout. No complications were encountered and no significant blood loss. FINDINGS: CT demonstrates air in fluid collection in the right chest, unchanged. Final CT demonstrates 42 French drain within the empyema of the right chest. IMPRESSION: Status post CT-guided right posterior chest tube for empyema. Signed, Yvone Neu. Reyne Dumas, RPVI [facility] Radiology Electronically Signed   By: Gilmer Mor D.O.   On: 05/23/2019 17:54    Assessment/Plan Active Problems:   Acute on chronic respiratory failure with hypoxia (HCC)   Ventilator associated pneumonia (HCC)   COPD, severe (HCC)   Left acute arterial ischemic stroke, MCA (middle cerebral artery) (HCC)   Cardiac arrest (HCC)   1. Acute on chronic respiratory failure hypoxia plan is  to continue with full support on the ventilator.  Patient has good saturations continue supportive measures and pulmonary toilet. 2. Ventilator associated pneumonia with possible aspiration pneumonia will need to be treated we will continue supportive care. 3. Severe COPD at baseline nebulizers as  necessary 4. Acute stroke no change continue with present management 5. Cardiac arrest right now rhythm is stable we will continue to follow along with   I have personally seen and evaluated the patient, evaluated laboratory and imaging results, formulated the assessment and plan and placed orders. The Patient requires high complexity decision making with multiple systems involvement.  Rounds were done with the Respiratory Therapy Director and Staff therapists and discussed with nursing staff also.  [name], MD [facility] Pulmonary Critical Care Medicine Sleep Medicine

## 2019-05-24 ENCOUNTER — Other Ambulatory Visit (HOSPITAL_COMMUNITY): Payer: Medicare Other

## 2019-05-24 DIAGNOSIS — I63512 Cerebral infarction due to unspecified occlusion or stenosis of left middle cerebral artery: Secondary | ICD-10-CM | POA: Diagnosis not present

## 2019-05-24 DIAGNOSIS — J449 Chronic obstructive pulmonary disease, unspecified: Secondary | ICD-10-CM | POA: Diagnosis not present

## 2019-05-24 DIAGNOSIS — I469 Cardiac arrest, cause unspecified: Secondary | ICD-10-CM | POA: Diagnosis not present

## 2019-05-24 DIAGNOSIS — J9621 Acute and chronic respiratory failure with hypoxia: Secondary | ICD-10-CM | POA: Diagnosis not present

## 2019-05-24 LAB — CULTURE, RESPIRATORY W GRAM STAIN

## 2019-05-24 NOTE — Progress Notes (Signed)
Pulmonary Critical Care Medicine [facility]   PULMONARY CRITICAL CARE SERVICE  PROGRESS NOTE  Date of Service: 05/24/2019  Garrett Zimmerman  QIW:[id number]  DOB: 25-Jan-1946   DOA: 05/19/2019  Referring Physician: Carron Curie, MD  HPI: Garrett Zimmerman is a 74 y.o. male seen for follow up of Acute on Chronic Respiratory Failure.  Patient at this time is on assist control mode has been on 45% FiO2 good volumes are noted.  Right now has not been tolerating any weaning  Medications: Reviewed on Rounds  Physical Exam:  Vitals: Temperature 97.8 pulse 88 respiratory rate 32 blood pressure is 124/64 saturations 96%  Ventilator Settings on assist control FiO2 45% tidal line 340 PEEP 5  . General: Comfortable at this time . Eyes: Grossly normal lids, irises & conjunctiva . ENT: grossly tongue is normal . Neck: no obvious mass . Cardiovascular: S1 S2 normal no gallop . Respiratory: No rhonchi coarse breath sounds are noted . Abdomen: soft . Skin: no rash seen on limited exam . Musculoskeletal: not rigid . Psychiatric:unable to assess . Neurologic: no seizure no involuntary movements         Lab Data:   Basic Metabolic Panel: Recent Labs  Lab 05/20/19 0516 05/22/19 0732  NA 144 142  K 5.1 4.1  CL 102 101  CO2 33* 30  GLUCOSE 128* 125*  BUN 25* 19  CREATININE 0.62 0.60*  CALCIUM 9.3 8.9    ABG: Recent Labs  Lab 05/20/19 0020  PHART 7.457*  PCO2ART 50.0*  PO2ART 91.3  HCO3 34.8*  O2SAT 97.3    Liver Function Tests: No results for input(s): AST, ALT, ALKPHOS, BILITOT, PROT, ALBUMIN in the last 168 hours. No results for input(s): LIPASE, AMYLASE in the last 168 hours. No results for input(s): AMMONIA in the last 168 hours.  CBC: Recent Labs  Lab 05/20/19 0516 05/22/19 0732  WBC 14.7* 22.2*  HGB 8.2* 8.6*  HCT 27.2* 28.3*  MCV 93.8 93.1  PLT 466* 464*    Cardiac Enzymes: No results for input(s): CKTOTAL, CKMB, CKMBINDEX, TROPONINI in  the last 168 hours.  BNP (last 3 results) No results for input(s): BNP in the last 8760 hours.  ProBNP (last 3 results) No results for input(s): PROBNP in the last 8760 hours.  Radiological Exams: DG Abd 1 View  Result Date: 05/22/2019 CLINICAL DATA:  Gastroesophageal reflux EXAM: ABDOMEN - 1 VIEW COMPARISON:  May 19, 2019 FINDINGS: Gastrostomy catheter is positioned in the stomach. There is contrast in the colon as well as in the urinary bladder. There is no bowel dilatation or air-fluid level to suggest bowel obstruction. No free air. Lung bases are clear. Pacemaker lead tips are attached to the right atrium and right ventricle. There are surgical clips in the right upper abdomen. There is aortic atherosclerosis. IMPRESSION: Gastrostomy catheter positioned in stomach region. No bowel obstruction or free air. Contrast in colon. Surgical clips right upper quadrant. Aortic Atherosclerosis (ICD10-I70.0). Electronically Signed   By: Bretta Bang III M.D.   On: 05/22/2019 19:15   CT CHEST W CONTRAST  Result Date: 05/22/2019 CLINICAL DATA:  Elevated white blood cell count EXAM: CT CHEST WITH CONTRAST TECHNIQUE: Multidetector CT imaging of the chest was performed during intravenous contrast administration. CONTRAST:  64mL OMNIPAQUE IOHEXOL 300 MG/ML  SOLN COMPARISON:  Chest x-ray from the previous day. FINDINGS: Cardiovascular: Thoracic aorta and its branches demonstrate atherosclerotic calcifications without aneurysmal dilatation or dissection. No cardiac enlargement is seen. Pacing device is  noted. Heavy coronary calcifications are noted. The pulmonary artery shows no large central pulmonary embolus although timing was not performed for embolus evaluation. Mediastinum/Nodes: Thoracic inlet demonstrates evidence of a tracheostomy tube in satisfactory position. No sizable hilar or mediastinal adenopathy is noted. Scattered small lymph nodes are seen with normal fatty hila. The esophagus is within  normal limits. Lungs/Pleura: Mild emphysematous changes are noted. The left lung is well aerated with the exception of mild lower lobe patchy infiltrate. No sizable effusion is noted. There is a loculated empyema identified with air within in the posterior aspect of the right chest wall. Some adjacent atelectasis/infiltrate is noted as well. No sizable parenchymal nodules are noted. Upper Abdomen: There are several rounded hyperdense lesions within the spleen the largest of which is noted on image number 128 of series 3. It measures 2 cm in greatest dimension. These likely represent hemangiomas. Fatty infiltration of the liver is noted. No other focal abnormality is seen. Musculoskeletal: Multiple old rib fractures are noted bilaterally with degrees of healing. No acute fracture is noted. No compression deformity is seen. IMPRESSION: Bibasilar consolidation with evidence of right-sided loculated fluid collection with air within. This likely represents a focal empyema. Hyperdense lesions within the spleen likely representing hemangiomas. Aortic Atherosclerosis (ICD10-I70.0) and Emphysema (ICD10-J43.9). Electronically Signed   By: Alcide Clever M.D.   On: 05/22/2019 17:22   CT IMAGE GUIDED DRAINAGE BY PERCUTANEOUS CATHETER  Result Date: 05/23/2019 INDICATION: 74 year old male with a history of empyema. EXAM: CT GUIDED DRAINAGE OF RIGHT CHEST ABSCESS MEDICATIONS: The patient is currently admitted to the hospital and receiving intravenous antibiotics. The antibiotics were administered within an appropriate time frame prior to the initiation of the procedure. ANESTHESIA/SEDATION: No sedation COMPLICATIONS: None TECHNIQUE: Informed written consent was obtained from the patient after a thorough discussion of the procedural risks, benefits and alternatives. All questions were addressed. Maximal Sterile Barrier Technique was utilized including caps, mask, sterile gowns, sterile gloves, sterile drape, hand hygiene and skin  antiseptic. A timeout was performed prior to the initiation of the procedure. PROCEDURE: Patient positioned in the right decubitus position on the CT gantry table. Scout CT was acquired. The operative field was prepped with Chlorhexidine in a sterile fashion, and a sterile drape was applied covering the operative field. A sterile gown and sterile gloves were used for the procedure. Local anesthesia was provided with 1% Lidocaine. 1% lidocaine was used for local anesthesia. Small stab incision was made with 11 blade scalpel. Using CT guidance, trocar needle was advanced into the empyema of the right chest. Once we confirmed position, modified Seldinger technique was used to place a 12 [place] drain. Drain was sutured in position and attached to pleura vac. Sample was sent for culture. Final CT was acquired. Patient tolerated the procedure well and remained hemodynamically stable throughout. No complications were encountered and no significant blood loss. FINDINGS: CT demonstrates air in fluid collection in the right chest, unchanged. Final CT demonstrates 63 French drain within the empyema of the right chest. IMPRESSION: Status post CT-guided right posterior chest tube for empyema. Signed, Yvone Neu. Reyne Dumas, RPVI [facility] Radiology Electronically Signed   By: Gilmer Mor D.O.   On: 05/23/2019 17:54    Assessment/Plan Active Problems:   Acute on chronic respiratory failure with hypoxia (HCC)   Ventilator associated pneumonia (HCC)   COPD, severe (HCC)   Left acute arterial ischemic stroke, MCA (middle cerebral artery) (HCC)   Cardiac arrest (HCC)  1. Acute on chronic respiratory failure hypoxia we will continue with full support on assist control mode currently is on 45% FiO2.  Try to titrate oxygen down as tolerated continue to check the RSB I 2. Failure associated pneumonia treated clinically improved 3. Severe COPD at baseline continue to follow  along 4. Left acute stroke no change physical therapy as tolerated 5. Cardiac arrest rhythm stable we will continue to follow   I have personally seen and evaluated the patient, evaluated laboratory and imaging results, formulated the assessment and plan and placed orders. The Patient requires high complexity decision making with multiple systems involvement.  Rounds were done with the Respiratory Therapy Director and Staff therapists and discussed with nursing staff also.  Allyne Gee, MD [facility] Pulmonary Critical Care Medicine Sleep Medicine

## 2019-05-24 NOTE — Progress Notes (Signed)
Referring Physician(s): Shayne Alken  Supervising Physician: Gilmer Mor  Patient Status:  [facility]-in[facility]  Chief Complaint: None- tracheostomy, lethargic.  Subjective:  Right empyema s/p right chest tube placement in IR 05/23/2019 by Dr. Loreta Ave.  Patient laying in bed, tracheostomy in place. He appears lethargic- he does not respond to voice. He does not follow simple commands. Right chest tube site c/d/i. Sating 100% with tracheostomy.   Allergies: Patient has no allergy information on record.  Medications: Prior to Admission medications   Not on File     Vital Signs: BP 135/75   Pulse (!) 101   SpO2 94%   Physical Exam Vitals and nursing note reviewed.  Constitutional:      General: He is not in acute distress.    Comments: Tracheostomy.  Pulmonary:     Effort: Pulmonary effort is normal. No respiratory distress.     Comments: Tracheostomy. Right chest tube site without erythema, drainage, or active bleeding; approximately 10 cc bloody fluid in pleure-vac, tube to water seal. Skin:    General: Skin is warm and dry.     Imaging: DG Abd 1 View  Result Date: 05/22/2019 CLINICAL DATA:  Gastroesophageal reflux EXAM: ABDOMEN - 1 VIEW COMPARISON:  May 19, 2019 FINDINGS: Gastrostomy catheter is positioned in the stomach. There is contrast in the colon as well as in the urinary bladder. There is no bowel dilatation or air-fluid level to suggest bowel obstruction. No free air. Lung bases are clear. Pacemaker lead tips are attached to the right atrium and right ventricle. There are surgical clips in the right upper abdomen. There is aortic atherosclerosis. IMPRESSION: Gastrostomy catheter positioned in stomach region. No bowel obstruction or free air. Contrast in colon. Surgical clips right upper quadrant. Aortic Atherosclerosis (ICD10-I70.0). Electronically Signed   By: Bretta Bang III M.D.   On: 05/22/2019 19:15   CT CHEST W CONTRAST  Result Date:  05/22/2019 CLINICAL DATA:  Elevated white blood cell count EXAM: CT CHEST WITH CONTRAST TECHNIQUE: Multidetector CT imaging of the chest was performed during intravenous contrast administration. CONTRAST:  28mL OMNIPAQUE IOHEXOL 300 MG/ML  SOLN COMPARISON:  Chest x-ray from the previous day. FINDINGS: Cardiovascular: Thoracic aorta and its branches demonstrate atherosclerotic calcifications without aneurysmal dilatation or dissection. No cardiac enlargement is seen. Pacing device is noted. Heavy coronary calcifications are noted. The pulmonary artery shows no large central pulmonary embolus although timing was not performed for embolus evaluation. Mediastinum/Nodes: Thoracic inlet demonstrates evidence of a tracheostomy tube in satisfactory position. No sizable hilar or mediastinal adenopathy is noted. Scattered small lymph nodes are seen with normal fatty hila. The esophagus is within normal limits. Lungs/Pleura: Mild emphysematous changes are noted. The left lung is well aerated with the exception of mild lower lobe patchy infiltrate. No sizable effusion is noted. There is a loculated empyema identified with air within in the posterior aspect of the right chest wall. Some adjacent atelectasis/infiltrate is noted as well. No sizable parenchymal nodules are noted. Upper Abdomen: There are several rounded hyperdense lesions within the spleen the largest of which is noted on image number 128 of series 3. It measures 2 cm in greatest dimension. These likely represent hemangiomas. Fatty infiltration of the liver is noted. No other focal abnormality is seen. Musculoskeletal: Multiple old rib fractures are noted bilaterally with degrees of healing. No acute fracture is noted. No compression deformity is seen. IMPRESSION: Bibasilar consolidation with evidence of right-sided loculated fluid collection with air within. This likely represents a  focal empyema. Hyperdense lesions within the spleen likely representing  hemangiomas. Aortic Atherosclerosis (ICD10-I70.0) and Emphysema (ICD10-J43.9). Electronically Signed   By: Alcide Clever M.D.   On: 05/22/2019 17:22   DG Chest Port 1 View  Result Date: 05/21/2019 CLINICAL DATA:  Pneumonia EXAM: PORTABLE CHEST 1 VIEW COMPARISON:  05/20/2019 FINDINGS: Cardiac shadow is stable. Pacing device is again seen. Persistent infiltrate is noted in the right base similar to that noted on the prior exam. A portion of this may represent some loculated fluid as well. No bony abnormality is seen. Tracheostomy tube is noted in satisfactory position. IMPRESSION: Stable opacity in the right base likely representing a combination of infiltrate and loculated fluid. Electronically Signed   By: Alcide Clever M.D.   On: 05/21/2019 19:22   DG CHEST PORT 1 VIEW  Result Date: 05/20/2019 CLINICAL DATA:  Respiratory failure EXAM: PORTABLE CHEST 1 VIEW COMPARISON:  None. FINDINGS: There is an endotracheal tube with the tip 4.5 cm above the carina. There is mild bilateral interstitial thickening. There is opacity overlying the right lower lung which may reflect right lower lobe pneumonia versus a loculated pleural effusion. There is no pneumothorax. The heart and mediastinal contours are unremarkable. There is a dual lead cardiac pacemaker. There is no acute osseous abnormality. IMPRESSION: 1. Endotracheal tube with the tip 4.5 cm above the carina. 2. Right lower lobe pneumonia versus a loculated pleural effusion. 3. Mild pulmonary vascular congestion. Electronically Signed   By: Elige Ko   On: 05/20/2019 11:22   CT IMAGE GUIDED DRAINAGE BY PERCUTANEOUS CATHETER  Result Date: 05/23/2019 INDICATION: 74 year old male with a history of empyema. EXAM: CT GUIDED DRAINAGE OF RIGHT CHEST ABSCESS MEDICATIONS: The patient is currently admitted to the hospital and receiving intravenous antibiotics. The antibiotics were administered within an appropriate time frame prior to the initiation of the procedure.  ANESTHESIA/SEDATION: No sedation COMPLICATIONS: None TECHNIQUE: Informed written consent was obtained from the patient after a thorough discussion of the procedural risks, benefits and alternatives. All questions were addressed. Maximal Sterile Barrier Technique was utilized including caps, mask, sterile gowns, sterile gloves, sterile drape, hand hygiene and skin antiseptic. A timeout was performed prior to the initiation of the procedure. PROCEDURE: Patient positioned in the right decubitus position on the CT gantry table. Scout CT was acquired. The operative field was prepped with Chlorhexidine in a sterile fashion, and a sterile drape was applied covering the operative field. A sterile gown and sterile gloves were used for the procedure. Local anesthesia was provided with 1% Lidocaine. 1% lidocaine was used for local anesthesia. Small stab incision was made with 11 blade scalpel. Using CT guidance, trocar needle was advanced into the empyema of the right chest. Once we confirmed position, modified Seldinger technique was used to place a 12 [place] drain. Drain was sutured in position and attached to pleura vac. Sample was sent for culture. Final CT was acquired. Patient tolerated the procedure well and remained hemodynamically stable throughout. No complications were encountered and no significant blood loss. FINDINGS: CT demonstrates air in fluid collection in the right chest, unchanged. Final CT demonstrates 3 French drain within the empyema of the right chest. IMPRESSION: Status post CT-guided right posterior chest tube for empyema. Signed, Yvone Neu. Reyne Dumas, RPVI [facility] Radiology Electronically Signed   By: Gilmer Mor D.O.   On: 05/23/2019 17:54    Labs:  CBC: Recent Labs    05/20/19 0516 05/22/19 0732  WBC 14.7* 22.2*  HGB 8.2* 8.6*  HCT 27.2* 28.3*  PLT 466* 464*    COAGS: No results for input(s): INR, APTT in the last 8760  hours.  BMP: Recent Labs    05/20/19 0516 05/22/19 0732  NA 144 142  K 5.1 4.1  CL 102 101  CO2 33* 30  GLUCOSE 128* 125*  BUN 25* 19  CALCIUM 9.3 8.9  CREATININE 0.62 0.60*  GFRNONAA >60 >60  GFRAA >60 >60     Assessment and Plan:  Right empyema s/p right chest tube placement in IR 05/23/2019 by Dr. Earleen Newport.  Right chest tube stable with 10 cc bloody fluid in pleure-vac, to water seal. Discussed case with Dr. Earleen Newport. Tube to be switched from water seal to suction. In addition, recommend TCTS involvement for this patient. Will obtain CXR in AM, order placed. Dr. Owens Shark made aware. Further plans per [facility]- appreciate and agree with management. IR to follow.   Electronically Signed: Earley Abide, PA-C 05/24/2019, 10:54 AM   I spent a total of 25 Minutes at the the patient's bedside AND on the patient's hospital floor or unit, greater than 50% of which was counseling/coordinating care for right empyema s/p right chest tube placement.

## 2019-05-25 ENCOUNTER — Other Ambulatory Visit (HOSPITAL_COMMUNITY): Payer: Medicare Other

## 2019-05-25 DIAGNOSIS — I469 Cardiac arrest, cause unspecified: Secondary | ICD-10-CM | POA: Diagnosis not present

## 2019-05-25 DIAGNOSIS — J9621 Acute and chronic respiratory failure with hypoxia: Secondary | ICD-10-CM | POA: Diagnosis not present

## 2019-05-25 DIAGNOSIS — I63512 Cerebral infarction due to unspecified occlusion or stenosis of left middle cerebral artery: Secondary | ICD-10-CM | POA: Diagnosis not present

## 2019-05-25 DIAGNOSIS — J449 Chronic obstructive pulmonary disease, unspecified: Secondary | ICD-10-CM | POA: Diagnosis not present

## 2019-05-25 LAB — CBC
HCT: 27.1 % — ABNORMAL LOW (ref 39.0–52.0)
Hemoglobin: 7.9 g/dL — ABNORMAL LOW (ref 13.0–17.0)
MCH: 26.8 pg (ref 26.0–34.0)
MCHC: 29.2 g/dL — ABNORMAL LOW (ref 30.0–36.0)
MCV: 91.9 fL (ref 80.0–100.0)
Platelets: 368 10*3/uL (ref 150–400)
RBC: 2.95 MIL/uL — ABNORMAL LOW (ref 4.22–5.81)
RDW: 17.2 % — ABNORMAL HIGH (ref 11.5–15.5)
WBC: 8.3 10*3/uL (ref 4.0–10.5)
nRBC: 0 % (ref 0.0–0.2)

## 2019-05-25 LAB — BASIC METABOLIC PANEL
Anion gap: 8 (ref 5–15)
BUN: 18 mg/dL (ref 8–23)
CO2: 31 mmol/L (ref 22–32)
Calcium: 9.1 mg/dL (ref 8.9–10.3)
Chloride: 104 mmol/L (ref 98–111)
Creatinine, Ser: 0.51 mg/dL — ABNORMAL LOW (ref 0.61–1.24)
GFR calc Af Amer: >60 mL/min (ref >60)
GFR calc non Af Amer: >60 mL/min (ref >60)
Glucose, Bld: 98 mg/dL (ref 70–99)
Potassium: 3.5 mmol/L (ref 3.5–5.1)
Sodium: 143 mmol/L (ref 135–145)

## 2019-05-25 MED ORDER — GENERIC EXTERNAL MEDICATION
Status: DC
Start: ? — End: 2019-05-25

## 2019-05-25 NOTE — Progress Notes (Signed)
Pulmonary Critical Care Medicine [place]   PULMONARY CRITICAL CARE SERVICE  PROGRESS NOTE  Date of Service: 05/25/2019  Garrett Zimmerman  CZY:[id number]  DOB: August 17, 1945   DOA: 05/19/2019  Referring Physician: Merton Border, MD  HPI: Garrett Zimmerman is a 74 y.o. male seen for follow up of Acute on Chronic Respiratory Failure.  Patient at this time is on pressure support 12/5 with trying to continue with advancing on weaning  Medications: Reviewed on Rounds  Physical Exam:  Vitals: Temperature is 98.7 pulse 78 respiratory 30 blood pressure is 116/63 saturations 100%  Ventilator Settings on pressure support FiO2 45% tidal volume 485 pressure poor 12/5  . General: Comfortable at this time . Eyes: Grossly normal lids, irises & conjunctiva . ENT: grossly tongue is normal . Neck: no obvious mass . Cardiovascular: S1 S2 normal no gallop . Respiratory: No rhonchi coarse breath sounds . Abdomen: soft . Skin: no rash seen on limited exam . Musculoskeletal: not rigid . Psychiatric:unable to assess . Neurologic: no seizure no involuntary movements         Lab Data:   Basic Metabolic Panel: Recent Labs  Lab 05/20/19 0516 05/22/19 0732 05/25/19 0454  NA 144 142 143  K 5.1 4.1 3.5  CL 102 101 104  CO2 33* 30 31  GLUCOSE 128* 125* 98  BUN 25* 19 18  CREATININE 0.62 0.60* 0.51*  CALCIUM 9.3 8.9 9.1    ABG: Recent Labs  Lab 05/20/19 0020  PHART 7.457*  PCO2ART 50.0*  PO2ART 91.3  HCO3 34.8*  O2SAT 97.3    Liver Function Tests: No results for input(s): AST, ALT, ALKPHOS, BILITOT, PROT, ALBUMIN in the last 168 hours. No results for input(s): LIPASE, AMYLASE in the last 168 hours. No results for input(s): AMMONIA in the last 168 hours.  CBC: Recent Labs  Lab 05/20/19 0516 05/22/19 0732 05/25/19 0454  WBC 14.7* 22.2* 8.3  HGB 8.2* 8.6* 7.9*  HCT 27.2* 28.3* 27.1*  MCV 93.8 93.1 91.9  PLT 466* 464* 368    Cardiac Enzymes: No results for  input(s): CKTOTAL, CKMB, CKMBINDEX, TROPONINI in the last 168 hours.  BNP (last 3 results) No results for input(s): BNP in the last 8760 hours.  ProBNP (last 3 results) No results for input(s): PROBNP in the last 8760 hours.  Radiological Exams: DG Chest Port 1 View  Result Date: 05/25/2019 CLINICAL DATA:  Follow-up of empyema. EXAM: PORTABLE CHEST 1 VIEW COMPARISON:  Chest tube placement of 05/23/2019. CT 05/22/2019. plain film 05/21/2019 FINDINGS: Tracheostomy appropriately position. Dual lead pacer. Numerous leads and wires project over the chest. Right-sided chest tube/pleural drain. Small bilateral pleural effusions. No pneumothorax. Improved right-sided aeration with interstitial edema and basilar predominant airspace disease remaining. New or progressive left base airspace disease. IMPRESSION: Right-sided chest tube placement, without acute complication or pneumothorax. Persistent congestive heart failure with bilateral pleural effusions and airspace disease. The left base airspace disease is progressive compared to 05/21/2019. Electronically Signed   By: Abigail Miyamoto M.D.   On: 05/25/2019 07:41   CT IMAGE GUIDED DRAINAGE BY PERCUTANEOUS CATHETER  Result Date: 05/23/2019 INDICATION: 74 year old male with a history of empyema. EXAM: CT GUIDED DRAINAGE OF RIGHT CHEST ABSCESS MEDICATIONS: The patient is currently admitted to the hospital and receiving intravenous antibiotics. The antibiotics were administered within an appropriate time frame prior to the initiation of the procedure. ANESTHESIA/SEDATION: No sedation COMPLICATIONS: None TECHNIQUE: Informed written consent was obtained from the patient after a thorough discussion  of the procedural risks, benefits and alternatives. All questions were addressed. Maximal Sterile Barrier Technique was utilized including caps, mask, sterile gowns, sterile gloves, sterile drape, hand hygiene and skin antiseptic. A timeout was performed prior to the  initiation of the procedure. PROCEDURE: Patient positioned in the right decubitus position on the CT gantry table. Scout CT was acquired. The operative field was prepped with Chlorhexidine in a sterile fashion, and a sterile drape was applied covering the operative field. A sterile gown and sterile gloves were used for the procedure. Local anesthesia was provided with 1% Lidocaine. 1% lidocaine was used for local anesthesia. Small stab incision was made with 11 blade scalpel. Using CT guidance, trocar needle was advanced into the empyema of the right chest. Once we confirmed position, modified Seldinger technique was used to place a 12 [place] drain. Drain was sutured in position and attached to pleura vac. Sample was sent for culture. Final CT was acquired. Patient tolerated the procedure well and remained hemodynamically stable throughout. No complications were encountered and no significant blood loss. FINDINGS: CT demonstrates air in fluid collection in the right chest, unchanged. Final CT demonstrates 82 French drain within the empyema of the right chest. IMPRESSION: Status post CT-guided right posterior chest tube for empyema. Signed, Yvone Neu. Reyne Dumas, RPVI [facility]iology [facility] Radiology Electronically Signed   By: Gilmer Mor D.O.   On: 05/23/2019 17:54    Assessment/Plan Active Problems:   Acute on chronic respiratory failure with hypoxia (HCC)   Ventilator associated pneumonia (HCC)   COPD, severe (HCC)   Left acute arterial ischemic stroke, MCA (middle cerebral artery) (HCC)   Cardiac arrest (HCC)   1. Acute on chronic respiratory failure with hypoxia plan is to continue with pressure support wean as tolerated continue secretion management pulmonary toilet. 2. Ventilator associated pneumonia treated clinically improving 3. Severe COPD at baseline we will continue to follow 4. Left MCA stroke no change therapy as tolerated 5. Cardiac arrhythmias  with cardiac arrest rhythm has been stable we will continue on telemetry   I have personally seen and evaluated the patient, evaluated laboratory and imaging results, formulated the assessment and plan and placed orders. The Patient requires high complexity decision making with multiple systems involvement.  Rounds were done with the Respiratory Therapy Director and Staff therapists and discussed with nursing staff also.  [name], MD [facility] Pulmonary Critical Care Medicine Sleep Medicine

## 2019-05-25 NOTE — Progress Notes (Signed)
Referring Physician(s): Shayne Alken  Supervising Physician: Gilmer Mor  Patient Status: SSH-inpt  Chief Complaint: None- tracheostomy, lethargic.  Subjective:  Right empyema s/p right chest tube placement in IR 05/23/2019 by Dr. Loreta Ave.  Patient laying in bed, tracheostomy in place. Still appears lethargic- he does not respond to voice and does not follow simple commands. Right chest tube site c/d/i. Sating 100% with tracheostomy.  CXR this AM: 1. Right-sided chest tube placement, without acute complication or pneumothorax. 2. Persistent congestive heart failure with bilateral pleural effusions and airspace disease. The left base airspace disease is progressive compared to 05/21/2019.   Allergies: Patient has no allergy information on record.  Medications: Prior to Admission medications   Not on File     Vital Signs: BP 135/75    Pulse (!) 101    SpO2 94%   Physical Exam Constitutional:      General: He is not in acute distress.    Comments: Tracheostomy.  Pulmonary:     Effort: Pulmonary effort is normal. No respiratory distress.     Comments: Tracheostomy. Right chest tube site without erythema, drainage, or active bleeding; approximately 10 cc bloody fluid in pleure-vac, tube to water seal. Skin:    General: Skin is warm and dry.     Imaging: DG Abd 1 View  Result Date: 05/22/2019 CLINICAL DATA:  Gastroesophageal reflux EXAM: ABDOMEN - 1 VIEW COMPARISON:  May 19, 2019 FINDINGS: Gastrostomy catheter is positioned in the stomach. There is contrast in the colon as well as in the urinary bladder. There is no bowel dilatation or air-fluid level to suggest bowel obstruction. No free air. Lung bases are clear. Pacemaker lead tips are attached to the right atrium and right ventricle. There are surgical clips in the right upper abdomen. There is aortic atherosclerosis. IMPRESSION: Gastrostomy catheter positioned in stomach region. No bowel obstruction  or free air. Contrast in colon. Surgical clips right upper quadrant. Aortic Atherosclerosis (ICD10-I70.0). Electronically Signed   By: Bretta Bang III M.D.   On: 05/22/2019 19:15   CT CHEST W CONTRAST  Result Date: 05/22/2019 CLINICAL DATA:  Elevated white blood cell count EXAM: CT CHEST WITH CONTRAST TECHNIQUE: Multidetector CT imaging of the chest was performed during intravenous contrast administration. CONTRAST:  20mL OMNIPAQUE IOHEXOL 300 MG/ML  SOLN COMPARISON:  Chest x-ray from the previous day. FINDINGS: Cardiovascular: Thoracic aorta and its branches demonstrate atherosclerotic calcifications without aneurysmal dilatation or dissection. No cardiac enlargement is seen. Pacing device is noted. Heavy coronary calcifications are noted. The pulmonary artery shows no large central pulmonary embolus although timing was not performed for embolus evaluation. Mediastinum/Nodes: Thoracic inlet demonstrates evidence of a tracheostomy tube in satisfactory position. No sizable hilar or mediastinal adenopathy is noted. Scattered small lymph nodes are seen with normal fatty hila. The esophagus is within normal limits. Lungs/Pleura: Mild emphysematous changes are noted. The left lung is well aerated with the exception of mild lower lobe patchy infiltrate. No sizable effusion is noted. There is a loculated empyema identified with air within in the posterior aspect of the right chest wall. Some adjacent atelectasis/infiltrate is noted as well. No sizable parenchymal nodules are noted. Upper Abdomen: There are several rounded hyperdense lesions within the spleen the largest of which is noted on image number 128 of series 3. It measures 2 cm in greatest dimension. These likely represent hemangiomas. Fatty infiltration of the liver is noted. No other focal abnormality is seen. Musculoskeletal: Multiple old rib fractures are noted bilaterally  with degrees of healing. No acute fracture is noted. No compression deformity  is seen. IMPRESSION: Bibasilar consolidation with evidence of right-sided loculated fluid collection with air within. This likely represents a focal empyema. Hyperdense lesions within the spleen likely representing hemangiomas. Aortic Atherosclerosis (ICD10-I70.0) and Emphysema (ICD10-J43.9). Electronically Signed   By: Alcide Clever M.D.   On: 05/22/2019 17:22   DG Chest Port 1 View  Result Date: 05/25/2019 CLINICAL DATA:  Follow-up of empyema. EXAM: PORTABLE CHEST 1 VIEW COMPARISON:  Chest tube placement of 05/23/2019. CT 05/22/2019. plain film 05/21/2019 FINDINGS: Tracheostomy appropriately position. Dual lead pacer. Numerous leads and wires project over the chest. Right-sided chest tube/pleural drain. Small bilateral pleural effusions. No pneumothorax. Improved right-sided aeration with interstitial edema and basilar predominant airspace disease remaining. New or progressive left base airspace disease. IMPRESSION: Right-sided chest tube placement, without acute complication or pneumothorax. Persistent congestive heart failure with bilateral pleural effusions and airspace disease. The left base airspace disease is progressive compared to 05/21/2019. Electronically Signed   By: Jeronimo Greaves M.D.   On: 05/25/2019 07:41   DG Chest Port 1 View  Result Date: 05/21/2019 CLINICAL DATA:  Pneumonia EXAM: PORTABLE CHEST 1 VIEW COMPARISON:  05/20/2019 FINDINGS: Cardiac shadow is stable. Pacing device is again seen. Persistent infiltrate is noted in the right base similar to that noted on the prior exam. A portion of this may represent some loculated fluid as well. No bony abnormality is seen. Tracheostomy tube is noted in satisfactory position. IMPRESSION: Stable opacity in the right base likely representing a combination of infiltrate and loculated fluid. Electronically Signed   By: Alcide Clever M.D.   On: 05/21/2019 19:22   CT IMAGE GUIDED DRAINAGE BY PERCUTANEOUS CATHETER  Result Date: 05/23/2019 INDICATION:  74 year old male with a history of empyema. EXAM: CT GUIDED DRAINAGE OF RIGHT CHEST ABSCESS MEDICATIONS: The patient is currently admitted to the hospital and receiving intravenous antibiotics. The antibiotics were administered within an appropriate time frame prior to the initiation of the procedure. ANESTHESIA/SEDATION: No sedation COMPLICATIONS: None TECHNIQUE: Informed written consent was obtained from the patient after a thorough discussion of the procedural risks, benefits and alternatives. All questions were addressed. Maximal Sterile Barrier Technique was utilized including caps, mask, sterile gowns, sterile gloves, sterile drape, hand hygiene and skin antiseptic. A timeout was performed prior to the initiation of the procedure. PROCEDURE: Patient positioned in the right decubitus position on the CT gantry table. Scout CT was acquired. The operative field was prepped with Chlorhexidine in a sterile fashion, and a sterile drape was applied covering the operative field. A sterile gown and sterile gloves were used for the procedure. Local anesthesia was provided with 1% Lidocaine. 1% lidocaine was used for local anesthesia. Small stab incision was made with 11 blade scalpel. Using CT guidance, trocar needle was advanced into the empyema of the right chest. Once we confirmed position, modified Seldinger technique was used to place a 12 [place] drain. Drain was sutured in position and attached to pleura vac. Sample was sent for culture. Final CT was acquired. Patient tolerated the procedure well and remained hemodynamically stable throughout. No complications were encountered and no significant blood loss. FINDINGS: CT demonstrates air in fluid collection in the right chest, unchanged. Final CT demonstrates 80 French drain within the empyema of the right chest. IMPRESSION: Status post CT-guided right posterior chest tube for empyema. Signed, Yvone Neu. Reyne Dumas, RPVI Vascular and Interventional Radiology  Specialists [facility] Radiology Electronically Signed   By: Marijean Niemann  Earleen Newport D.O.   On: 05/23/2019 17:54    Labs:  CBC: Recent Labs    05/20/19 0516 05/22/19 0732 05/25/19 0454  WBC 14.7* 22.2* 8.3  HGB 8.2* 8.6* 7.9*  HCT 27.2* 28.3* 27.1*  PLT 466* 464* 368    COAGS: No results for input(s): INR, APTT in the last 8760 hours.  BMP: Recent Labs    05/20/19 0516 05/22/19 0732 05/25/19 0454  NA 144 142 143  K 5.1 4.1 3.5  CL 102 101 104  CO2 33* 30 31  GLUCOSE 128* 125* 98  BUN 25* 19 18  CALCIUM 9.3 8.9 9.1  CREATININE 0.62 0.60* 0.51*  GFRNONAA >60 >60 >60  GFRAA >60 >60 >60     Assessment and Plan:  Right empyema s/p right chest tube placement in IR 05/23/2019 by Dr. Earleen Newport.  Right chest tube stable with approximately 58 cc bloody fluid in pleure-vac, to water seal. Discussed case with Dr. Owens Shark and RT- issues with placing tube to suction, per RT they are looking for correct connection device to facilitate this. Please ensure that tube is to suction per Dr. Earleen Newport. Will obtain CXR in AM, order placed. Further plans per [facility]- appreciate and agree with management. IR to follow.   Electronically Signed: Earley Abide, PA-C 05/25/2019, 9:44 AM   I spent a total of 25 Minutes at the the patient's bedside AND on the patient's hospital floor or unit, greater than 50% of which was counseling/coordinating care for right empyema s/p right chest tube placement.

## 2019-05-26 ENCOUNTER — Other Ambulatory Visit (HOSPITAL_COMMUNITY): Payer: Medicare Other

## 2019-05-26 DIAGNOSIS — I469 Cardiac arrest, cause unspecified: Secondary | ICD-10-CM | POA: Diagnosis not present

## 2019-05-26 DIAGNOSIS — I63512 Cerebral infarction due to unspecified occlusion or stenosis of left middle cerebral artery: Secondary | ICD-10-CM | POA: Diagnosis not present

## 2019-05-26 DIAGNOSIS — J449 Chronic obstructive pulmonary disease, unspecified: Secondary | ICD-10-CM | POA: Diagnosis not present

## 2019-05-26 DIAGNOSIS — J9621 Acute and chronic respiratory failure with hypoxia: Secondary | ICD-10-CM | POA: Diagnosis not present

## 2019-05-26 LAB — CULTURE, BLOOD (ROUTINE X 2)
Culture: NO GROWTH
Culture: NO GROWTH

## 2019-05-26 MED ORDER — GENERIC EXTERNAL MEDICATION
Status: DC
Start: ? — End: 2019-05-26

## 2019-05-26 NOTE — Progress Notes (Signed)
Pulmonary Critical Care Medicine [facility] [place]   PULMONARY CRITICAL CARE SERVICE  PROGRESS NOTE  Date of Service: 05/26/2019  Garrett Zimmerman  DZH:[id number]  DOB: [birth year]   DOA: 05/19/2019  Referring Physician: Carron Curie, MD  HPI: Garrett Zimmerman is a 74 y.o. male seen for follow up of Acute on Chronic Respiratory Failure.  Patient at this time is on pressure support has been on 45% FiO2 today the goal is to try for 8 hours on weaning and pressure support if patient is able to tolerate  Medications: Reviewed on Rounds  Physical Exam:  Vitals: Temperature 97.2 pulse 82 respiratory 23 blood pressure is 121/62 saturations 95%  Ventilator Settings on pressure support FiO2 45% pressure 12/5  . General: Comfortable at this time . Eyes: Grossly normal lids, irises & conjunctiva . ENT: grossly tongue is normal . Neck: no obvious mass . Cardiovascular: S1 S2 normal no gallop . Respiratory: Coarse breath sounds with few scattered rhonchi are noted . Abdomen: soft . Skin: no rash seen on limited exam . Musculoskeletal: not rigid . Psychiatric:unable to assess . Neurologic: no seizure no involuntary movements         Lab Data:   Basic Metabolic Panel: Recent Labs  Lab 05/20/19 0516 05/22/19 0732 05/25/19 0454  NA 144 142 143  K 5.1 4.1 3.5  CL 102 101 104  CO2 33* 30 31  GLUCOSE 128* 125* 98  BUN 25* 19 18  CREATININE 0.62 0.60* 0.51*  CALCIUM 9.3 8.9 9.1    ABG: Recent Labs  Lab 05/20/19 0020  PHART 7.457*  PCO2ART 50.0*  PO2ART 91.3  HCO3 34.8*  O2SAT 97.3    Liver Function Tests: No results for input(s): AST, ALT, ALKPHOS, BILITOT, PROT, ALBUMIN in the last 168 hours. No results for input(s): LIPASE, AMYLASE in the last 168 hours. No results for input(s): AMMONIA in the last 168 hours.  CBC: Recent Labs  Lab 05/20/19 0516 05/22/19 0732 05/25/19 0454  WBC 14.7* 22.2* 8.3  HGB 8.2* 8.6* 7.9*  HCT 27.2* 28.3* 27.1*  MCV 93.8 93.1  91.9  PLT 466* 464* 368    Cardiac Enzymes: No results for input(s): CKTOTAL, CKMB, CKMBINDEX, TROPONINI in the last 168 hours.  BNP (last 3 results) No results for input(s): BNP in the last 8760 hours.  ProBNP (last 3 results) No results for input(s): PROBNP in the last 8760 hours.  Radiological Exams: DG Chest Port 1 View  Result Date: 05/26/2019 CLINICAL DATA:  74 year old male with right lung empyema status post CT-guided drainage of empyema on 05/23/2019. EXAM: PORTABLE CHEST 1 VIEW COMPARISON:  Portable chest 05/25/2019 and earlier. FINDINGS: Portable AP semi upright view at 0549 hours. Tracheostomy tube is stable. Stable pigtail right chest tube located at the lung base. Stable lung volumes. Continued veiling opacity in the right mid and lower lung. Stable mild increased interstitial markings elsewhere. No pneumothorax identified. Mediastinal contours remain normal. Stable left chest pacemaker. IMPRESSION: 1. Stable right pleural drain with no pneumothorax identified. 2. Continued veiling opacity in the right lower lung. No new cardiopulmonary abnormality. Electronically Signed   By: Odessa Fleming M.D.   On: 05/26/2019 06:41   DG Chest Port 1 View  Result Date: 05/25/2019 CLINICAL DATA:  Follow-up of empyema. EXAM: PORTABLE CHEST 1 VIEW COMPARISON:  Chest tube placement of 05/23/2019. CT 05/22/2019. plain film 05/21/2019 FINDINGS: Tracheostomy appropriately position. Dual lead pacer. Numerous leads and wires project over the chest. Right-sided chest tube/pleural drain. Small  bilateral pleural effusions. No pneumothorax. Improved right-sided aeration with interstitial edema and basilar predominant airspace disease remaining. New or progressive left base airspace disease. IMPRESSION: Right-sided chest tube placement, without acute complication or pneumothorax. Persistent congestive heart failure with bilateral pleural effusions and airspace disease. The left base airspace disease is progressive  compared to 05/21/2019. Electronically Signed   By: Abigail Miyamoto M.D.   On: 05/25/2019 07:41    Assessment/Plan Active Problems:   Acute on chronic respiratory failure with hypoxia (HCC)   Ventilator associated pneumonia (HCC)   COPD, severe (HCC)   Left acute arterial ischemic stroke, MCA (middle cerebral artery) ([place])   Cardiac arrest ([place])   1. Acute on chronic respiratory failure with hypoxia patient currently is on pressure support wean 8-hour goal 2. Ventilator associated pneumonia we will treat as warranted right now is stable 3. Severe COPD at baseline nebulizers as necessary 4. Acute stroke no change physical therapy 5. Cardiac arrest rhythm stable we will continue to follow   I have personally seen and evaluated the patient, evaluated laboratory and imaging results, formulated the assessment and plan and placed orders. The Patient requires high complexity decision making with multiple systems involvement.  Rounds were done with the Respiratory Therapy Director and Staff therapists and discussed with nursing staff also.  Allyne Gee, MD [facility] Pulmonary Critical Care Medicine Sleep Medicine

## 2019-05-26 NOTE — Progress Notes (Signed)
Referring Physician(s): Right chest tube drain  Supervising Physician: Sandi Mariscal  Patient Status:  Select IP  Chief Complaint:  4/16:Procedure: CT guided right chest drain, with 92F drain into empyema.   Subjective:  Chest tube intact CXR today:IMPRESSION: 1. Stable right pleural drain with no pneumothorax identified. 2. Continued veiling opacity in the right lower lung. No new cardiopulmonary abnormality.  100 cc in vac; dark serous color No air leak  Allergies: Patient has no allergy information on record.  Medications: Prior to Admission medications   Not on File     Vital Signs: BP 135/75   Pulse (!) 101   SpO2 94%   Physical Exam Skin:    General: Skin is warm and dry.     Comments: Site of CT is clean and dry NT no bleeding  Chest tube intact OP dark serous color-- 1000 cc in vac No air leak      Imaging: DG Abd 1 View  Result Date: 05/22/2019 CLINICAL DATA:  Gastroesophageal reflux EXAM: ABDOMEN - 1 VIEW COMPARISON:  May 19, 2019 FINDINGS: Gastrostomy catheter is positioned in the stomach. There is contrast in the colon as well as in the urinary bladder. There is no bowel dilatation or air-fluid level to suggest bowel obstruction. No free air. Lung bases are clear. Pacemaker lead tips are attached to the right atrium and right ventricle. There are surgical clips in the right upper abdomen. There is aortic atherosclerosis. IMPRESSION: Gastrostomy catheter positioned in stomach region. No bowel obstruction or free air. Contrast in colon. Surgical clips right upper quadrant. Aortic Atherosclerosis (ICD10-I70.0). Electronically Signed   By: Lowella Grip III M.D.   On: 05/22/2019 19:15   CT CHEST W CONTRAST  Result Date: 05/22/2019 CLINICAL DATA:  Elevated white blood cell count EXAM: CT CHEST WITH CONTRAST TECHNIQUE: Multidetector CT imaging of the chest was performed during intravenous contrast administration. CONTRAST:  24mL OMNIPAQUE  IOHEXOL 300 MG/ML  SOLN COMPARISON:  Chest x-ray from the previous day. FINDINGS: Cardiovascular: Thoracic aorta and its branches demonstrate atherosclerotic calcifications without aneurysmal dilatation or dissection. No cardiac enlargement is seen. Pacing device is noted. Heavy coronary calcifications are noted. The pulmonary artery shows no large central pulmonary embolus although timing was not performed for embolus evaluation. Mediastinum/Nodes: Thoracic inlet demonstrates evidence of a tracheostomy tube in satisfactory position. No sizable hilar or mediastinal adenopathy is noted. Scattered small lymph nodes are seen with normal fatty hila. The esophagus is within normal limits. Lungs/Pleura: Mild emphysematous changes are noted. The left lung is well aerated with the exception of mild lower lobe patchy infiltrate. No sizable effusion is noted. There is a loculated empyema identified with air within in the posterior aspect of the right chest wall. Some adjacent atelectasis/infiltrate is noted as well. No sizable parenchymal nodules are noted. Upper Abdomen: There are several rounded hyperdense lesions within the spleen the largest of which is noted on image number 128 of series 3. It measures 2 cm in greatest dimension. These likely represent hemangiomas. Fatty infiltration of the liver is noted. No other focal abnormality is seen. Musculoskeletal: Multiple old rib fractures are noted bilaterally with degrees of healing. No acute fracture is noted. No compression deformity is seen. IMPRESSION: Bibasilar consolidation with evidence of right-sided loculated fluid collection with air within. This likely represents a focal empyema. Hyperdense lesions within the spleen likely representing hemangiomas. Aortic Atherosclerosis (ICD10-I70.0) and Emphysema (ICD10-J43.9). Electronically Signed   By: Inez Catalina M.D.   On: 05/22/2019 17:22  DG Chest Port 1 View  Result Date: 05/26/2019 CLINICAL DATA:  74 year old  male with right lung empyema status post CT-guided drainage of empyema on 05/23/2019. EXAM: PORTABLE CHEST 1 VIEW COMPARISON:  Portable chest 05/25/2019 and earlier. FINDINGS: Portable AP semi upright view at 0549 hours. Tracheostomy tube is stable. Stable pigtail right chest tube located at the lung base. Stable lung volumes. Continued veiling opacity in the right mid and lower lung. Stable mild increased interstitial markings elsewhere. No pneumothorax identified. Mediastinal contours remain normal. Stable left chest pacemaker. IMPRESSION: 1. Stable right pleural drain with no pneumothorax identified. 2. Continued veiling opacity in the right lower lung. No new cardiopulmonary abnormality. Electronically Signed   By: Odessa Fleming M.D.   On: 05/26/2019 06:41   DG Chest Port 1 View  Result Date: 05/25/2019 CLINICAL DATA:  Follow-up of empyema. EXAM: PORTABLE CHEST 1 VIEW COMPARISON:  Chest tube placement of 05/23/2019. CT 05/22/2019. plain film 05/21/2019 FINDINGS: Tracheostomy appropriately position. Dual lead pacer. Numerous leads and wires project over the chest. Right-sided chest tube/pleural drain. Small bilateral pleural effusions. No pneumothorax. Improved right-sided aeration with interstitial edema and basilar predominant airspace disease remaining. New or progressive left base airspace disease. IMPRESSION: Right-sided chest tube placement, without acute complication or pneumothorax. Persistent congestive heart failure with bilateral pleural effusions and airspace disease. The left base airspace disease is progressive compared to 05/21/2019. Electronically Signed   By: Jeronimo Greaves M.D.   On: 05/25/2019 07:41   CT IMAGE GUIDED DRAINAGE BY PERCUTANEOUS CATHETER  Result Date: 05/23/2019 INDICATION: 74 year old male with a history of empyema. EXAM: CT GUIDED DRAINAGE OF RIGHT CHEST ABSCESS MEDICATIONS: The patient is currently admitted to the hospital and receiving intravenous antibiotics. The antibiotics  were administered within an appropriate time frame prior to the initiation of the procedure. ANESTHESIA/SEDATION: No sedation COMPLICATIONS: None TECHNIQUE: Informed written consent was obtained from the patient after a thorough discussion of the procedural risks, benefits and alternatives. All questions were addressed. Maximal Sterile Barrier Technique was utilized including caps, mask, sterile gowns, sterile gloves, sterile drape, hand hygiene and skin antiseptic. A timeout was performed prior to the initiation of the procedure. PROCEDURE: Patient positioned in the right decubitus position on the CT gantry table. Scout CT was acquired. The operative field was prepped with Chlorhexidine in a sterile fashion, and a sterile drape was applied covering the operative field. A sterile gown and sterile gloves were used for the procedure. Local anesthesia was provided with 1% Lidocaine. 1% lidocaine was used for local anesthesia. Small stab incision was made with 11 blade scalpel. Using CT guidance, trocar needle was advanced into the empyema of the right chest. Once we confirmed position, modified Seldinger technique was used to place a 12 [place] drain. Drain was sutured in position and attached to pleura vac. Sample was sent for culture. Final CT was acquired. Patient tolerated the procedure well and remained hemodynamically stable throughout. No complications were encountered and no significant blood loss. FINDINGS: CT demonstrates air in fluid collection in the right chest, unchanged. Final CT demonstrates 21 French drain within the empyema of the right chest. IMPRESSION: Status post CT-guided right posterior chest tube for empyema. Signed, Yvone Neu. Reyne Dumas, RPVI Vascular and Interventional Radiology Specialists [facility] Radiology Electronically Signed   By: Gilmer Mor D.O.   On: 05/23/2019 17:54    Labs:  CBC: Recent Labs    05/20/19 0516 05/22/19 0732 05/25/19 0454  WBC 14.7* 22.2* 8.3  HGB 8.2*  8.6* 7.9*  HCT 27.2* 28.3* 27.1*  PLT 466* 464* 368    COAGS: No results for input(s): INR, APTT in the last 8760 hours.  BMP: Recent Labs    05/20/19 0516 05/22/19 0732 05/25/19 0454  NA 144 142 143  K 5.1 4.1 3.5  CL 102 101 104  CO2 33* 30 31  GLUCOSE 128* 125* 98  BUN 25* 19 18  CALCIUM 9.3 8.9 9.1  CREATININE 0.62 0.60* 0.51*  GFRNONAA >60 >60 >60  GFRAA >60 >60 >60    LIVER FUNCTION TESTS: No results for input(s): BILITOT, AST, ALT, ALKPHOS, PROT, ALBUMIN in the last 8760 hours.  Assessment and Plan:  Empyema Right chest tube intact OP dark serous CXR shows No PTX with CT good postion Will follow   Electronically Signed: Robet Leu, PA-C 05/26/2019, 8:44 AM   I spent a total of 15 Minutes at the the patient's bedside AND on the patient's hospital floor or unit, greater than 50% of which was counseling/coordinating care for right chest tube

## 2019-05-27 DIAGNOSIS — J449 Chronic obstructive pulmonary disease, unspecified: Secondary | ICD-10-CM | POA: Diagnosis not present

## 2019-05-27 DIAGNOSIS — J9621 Acute and chronic respiratory failure with hypoxia: Secondary | ICD-10-CM | POA: Diagnosis not present

## 2019-05-27 DIAGNOSIS — I63512 Cerebral infarction due to unspecified occlusion or stenosis of left middle cerebral artery: Secondary | ICD-10-CM | POA: Diagnosis not present

## 2019-05-27 DIAGNOSIS — I469 Cardiac arrest, cause unspecified: Secondary | ICD-10-CM | POA: Diagnosis not present

## 2019-05-27 LAB — BASIC METABOLIC PANEL
Anion gap: 9 (ref 5–15)
BUN: 15 mg/dL (ref 8–23)
CO2: 31 mmol/L (ref 22–32)
Calcium: 9.1 mg/dL (ref 8.9–10.3)
Chloride: 104 mmol/L (ref 98–111)
Creatinine, Ser: 0.48 mg/dL — ABNORMAL LOW (ref 0.61–1.24)
GFR calc Af Amer: >60 mL/min (ref >60)
GFR calc non Af Amer: >60 mL/min (ref >60)
Glucose, Bld: 115 mg/dL — ABNORMAL HIGH (ref 70–99)
Potassium: 3.8 mmol/L (ref 3.5–5.1)
Sodium: 144 mmol/L (ref 135–145)

## 2019-05-27 LAB — MAGNESIUM: Magnesium: 1.7 mg/dL (ref 1.7–2.4)

## 2019-05-27 LAB — CBC
HCT: 28.2 % — ABNORMAL LOW (ref 39.0–52.0)
Hemoglobin: 8.5 g/dL — ABNORMAL LOW (ref 13.0–17.0)
MCH: 27.1 pg (ref 26.0–34.0)
MCHC: 30.1 g/dL (ref 30.0–36.0)
MCV: 89.8 fL (ref 80.0–100.0)
Platelets: 421 10*3/uL — ABNORMAL HIGH (ref 150–400)
RBC: 3.14 MIL/uL — ABNORMAL LOW (ref 4.22–5.81)
RDW: 17.2 % — ABNORMAL HIGH (ref 11.5–15.5)
WBC: 8.7 10*3/uL (ref 4.0–10.5)
nRBC: 0 % (ref 0.0–0.2)

## 2019-05-27 NOTE — Progress Notes (Signed)
Pulmonary Critical Care Medicine [place]   PULMONARY CRITICAL CARE SERVICE  PROGRESS NOTE  Date of Service: 05/27/2019  Garrett Zimmerman  YWV:[id number]  DOB: [birth year]   DOA: 05/19/2019  Referring Physician: Merton Border, MD  HPI: Garrett Zimmerman is a 74 y.o. male seen for follow up of Acute on Chronic Respiratory Failure.  Patient currently is on pressure support has been on 40% FiO2 and the goal is for 12 hours  Medications: Reviewed on Rounds  Physical Exam:  Vitals: Temperature 97.2 pulse 78 respiratory 24 blood pressure is 133/61 saturations 96%  Ventilator Settings on pressure support FiO2 40% pressure support 12 PEEP 5  . General: Comfortable at this time . Eyes: Grossly normal lids, irises & conjunctiva . ENT: grossly tongue is normal . Neck: no obvious mass . Cardiovascular: S1 S2 normal no gallop . Respiratory: No rhonchi no rales . Abdomen: soft . Skin: no rash seen on limited exam . Musculoskeletal: not rigid . Psychiatric:unable to assess . Neurologic: no seizure no involuntary movements         Lab Data:   Basic Metabolic Panel: Recent Labs  Lab 05/22/19 0732 05/25/19 0454 05/27/19 0602  NA 142 143 144  K 4.1 3.5 3.8  CL 101 104 104  CO2 30 31 31   GLUCOSE 125* 98 115*  BUN 19 18 15   CREATININE 0.60* 0.51* 0.48*  CALCIUM 8.9 9.1 9.1  MG  --   --  1.7    ABG: No results for input(s): PHART, PCO2ART, PO2ART, HCO3, O2SAT in the last 168 hours.  Liver Function Tests: No results for input(s): AST, ALT, ALKPHOS, BILITOT, PROT, ALBUMIN in the last 168 hours. No results for input(s): LIPASE, AMYLASE in the last 168 hours. No results for input(s): AMMONIA in the last 168 hours.  CBC: Recent Labs  Lab 05/22/19 0732 05/25/19 0454 05/27/19 0602  WBC 22.2* 8.3 8.7  HGB 8.6* 7.9* 8.5*  HCT 28.3* 27.1* 28.2*  MCV 93.1 91.9 89.8  PLT 464* 368 421*    Cardiac Enzymes: No results for input(s): CKTOTAL, CKMB, CKMBINDEX,  TROPONINI in the last 168 hours.  BNP (last 3 results) No results for input(s): BNP in the last 8760 hours.  ProBNP (last 3 results) No results for input(s): PROBNP in the last 8760 hours.  Radiological Exams: DG Chest Port 1 View  Result Date: 05/26/2019 CLINICAL DATA:  74 year old male with right lung empyema status post CT-guided drainage of empyema on 05/23/2019. EXAM: PORTABLE CHEST 1 VIEW COMPARISON:  Portable chest 05/25/2019 and earlier. FINDINGS: Portable AP semi upright view at 0549 hours. Tracheostomy tube is stable. Stable pigtail right chest tube located at the lung base. Stable lung volumes. Continued veiling opacity in the right mid and lower lung. Stable mild increased interstitial markings elsewhere. No pneumothorax identified. Mediastinal contours remain normal. Stable left chest pacemaker. IMPRESSION: 1. Stable right pleural drain with no pneumothorax identified. 2. Continued veiling opacity in the right lower lung. No new cardiopulmonary abnormality. Electronically Signed   By: Genevie Ann M.D.   On: 05/26/2019 06:41    Assessment/Plan Active Problems:   Acute on chronic respiratory failure with hypoxia (HCC)   Ventilator associated pneumonia (HCC)   COPD, severe (HCC)   Left acute arterial ischemic stroke, MCA (middle cerebral artery) ([place])   Cardiac arrest ([place])   1. Acute on chronic respiratory failure with hypoxia currently is on pressure support with 40% FiO2 remains on a goal of 12 hours on a  pressure of 12/5 2. Ventilator associated pneumonia treated we will continue with present management 3. Severe COPD medical management 4. Left stroke no change we will continue supportive care 5. Cardiac arrest rhythm has been stable   I have personally seen and evaluated the patient, evaluated laboratory and imaging results, formulated the assessment and plan and placed orders. The Patient requires high complexity decision making with multiple systems involvement.  Rounds  were done with the Respiratory Therapy Director and Staff therapists and discussed with nursing staff also.  [name], MD [facility] Pulmonary Critical Care Medicine Sleep Medicine

## 2019-05-28 DIAGNOSIS — I63512 Cerebral infarction due to unspecified occlusion or stenosis of left middle cerebral artery: Secondary | ICD-10-CM | POA: Diagnosis not present

## 2019-05-28 DIAGNOSIS — I469 Cardiac arrest, cause unspecified: Secondary | ICD-10-CM | POA: Diagnosis not present

## 2019-05-28 DIAGNOSIS — J449 Chronic obstructive pulmonary disease, unspecified: Secondary | ICD-10-CM | POA: Diagnosis not present

## 2019-05-28 DIAGNOSIS — J9621 Acute and chronic respiratory failure with hypoxia: Secondary | ICD-10-CM | POA: Diagnosis not present

## 2019-05-28 MED ORDER — GENERIC EXTERNAL MEDICATION
Status: DC
Start: ? — End: 2019-05-28

## 2019-05-28 NOTE — Progress Notes (Addendum)
Pulmonary Critical Care Medicine [facility] [place]   PULMONARY CRITICAL CARE SERVICE  PROGRESS NOTE  Date of Service: 05/28/2019  Garrett Zimmerman  [id number]  DOB: [birth year]   DOA: 05/19/2019  Referring Physician: Carron Curie, MD  HPI: Garrett Zimmerman is a 74 y.o. male seen for follow up of Acute on Chronic Respiratory Failure.  Patient has a 16-hour goal pressure support at this time currently on 30% FiO2 satting well with no distress.  Medications: Reviewed on Rounds  Physical Exam:  Vitals: Pulse 97 respirations 40 BP 124/66 O2 sat 93% temp 91.9  Ventilator Settings pressure support 12/5 FiO2 30%  . General: Comfortable at this time . Eyes: Grossly normal lids, irises & conjunctiva . ENT: grossly tongue is normal . Neck: no obvious mass . Cardiovascular: S1 S2 normal no gallop . Respiratory: No rales or rhonchi noted . Abdomen: soft . Skin: no rash seen on limited exam . Musculoskeletal: not rigid . Psychiatric:unable to assess . Neurologic: no seizure no involuntary movements         Lab Data:   Basic Metabolic Panel: Recent Labs  Lab 05/22/19 0732 05/25/19 0454 05/27/19 0602  NA 142 143 144  K 4.1 3.5 3.8  CL 101 104 104  CO2 30 31 31   GLUCOSE 125* 98 115*  BUN 19 18 15   CREATININE 0.60* 0.51* 0.48*  CALCIUM 8.9 9.1 9.1  MG  --   --  1.7    ABG: No results for input(s): PHART, PCO2ART, PO2ART, HCO3, O2SAT in the last 168 hours.  Liver Function Tests: No results for input(s): AST, ALT, ALKPHOS, BILITOT, PROT, ALBUMIN in the last 168 hours. No results for input(s): LIPASE, AMYLASE in the last 168 hours. No results for input(s): AMMONIA in the last 168 hours.  CBC: Recent Labs  Lab 05/22/19 0732 05/25/19 0454 05/27/19 0602  WBC 22.2* 8.3 8.7  HGB 8.6* 7.9* 8.5*  HCT 28.3* 27.1* 28.2*  MCV 93.1 91.9 89.8  PLT 464* 368 421*    Cardiac Enzymes: No results for input(s): CKTOTAL, CKMB, CKMBINDEX, TROPONINI in the last 168  hours.  BNP (last 3 results) No results for input(s): BNP in the last 8760 hours.  ProBNP (last 3 results) No results for input(s): PROBNP in the last 8760 hours.  Radiological Exams: No results found.  Assessment/Plan Active Problems:   Acute on chronic respiratory failure with hypoxia (HCC)   Ventilator associated pneumonia (HCC)   COPD, severe (HCC)   Left acute arterial ischemic stroke, MCA (middle cerebral artery) (HCC)   Cardiac arrest (HCC)   1. Acute on chronic respiratory failure with hypoxia currently is on pressure support with 30% FiO2 remains on a goal of 16 hours on a pressure of 12/5 2. Ventilator associated pneumonia treated we will continue with present management 3. Severe COPD medical management 4. Left stroke no change we will continue supportive care 5. Cardiac arrest rhythm has been stable   I have personally seen and evaluated the patient, evaluated laboratory and imaging results, formulated the assessment and plan and placed orders. The Patient requires high complexity decision making with multiple systems involvement.  Rounds were done with the Respiratory Therapy Director and Staff therapists and discussed with nursing staff also.  05/29/19, MD [facility] Pulmonary Critical Care Medicine Sleep Medicine

## 2019-05-28 NOTE — Progress Notes (Addendum)
Referring Physician(s): Rosaria Ferries  Supervising Physician: Arne Cleveland  Patient Status:  [facility]-in[facility]  Chief Complaint: None- tracheostomy  Subjective:  Right empyema s/p right chest tube placement in IR 05/23/2019 by Dr. Earleen Newport. Patient awake and alert laying in bed, tracheostomy in place. Nods appropriately to yes/no questions. Accompanied by son at bedside. Right chest tube site c/d/i. Sating 94-96% with tracheostomy.   Allergies: Patient has no allergy information on record.  Medications: Prior to Admission medications   Not on File     Vital Signs: BP 135/75   Pulse (!) 101   SpO2 94%   Physical Exam Vitals and nursing note reviewed.  Constitutional:      General: He is not in acute distress.    Comments: Tracheostomy.   Cardiovascular:     Comments: Right chest tube site without erythema, drainage, or active bleeding; approximately 250 cc purulent fluid in pleure-vac, tube to suction with (-) air leak. Pulmonary:     Effort: Pulmonary effort is normal. No respiratory distress.     Comments: Tracheostomy.  Skin:    General: Skin is warm and dry.  Neurological:     Mental Status: He is alert.     Imaging: DG Chest Port 1 View  Result Date: 05/26/2019 CLINICAL DATA:  74 year old male with right lung empyema status post CT-guided drainage of empyema on 05/23/2019. EXAM: PORTABLE CHEST 1 VIEW COMPARISON:  Portable chest 05/25/2019 and earlier. FINDINGS: Portable AP semi upright view at 0549 hours. Tracheostomy tube is stable. Stable pigtail right chest tube located at the lung base. Stable lung volumes. Continued veiling opacity in the right mid and lower lung. Stable mild increased interstitial markings elsewhere. No pneumothorax identified. Mediastinal contours remain normal. Stable left chest pacemaker. IMPRESSION: 1. Stable right pleural drain with no pneumothorax identified. 2. Continued veiling opacity in the right lower lung. No new  cardiopulmonary abnormality. Electronically Signed   By: Genevie Ann M.D.   On: 05/26/2019 06:41   DG Chest Port 1 View  Result Date: 05/25/2019 CLINICAL DATA:  Follow-up of empyema. EXAM: PORTABLE CHEST 1 VIEW COMPARISON:  Chest tube placement of 05/23/2019. CT 05/22/2019. plain film 05/21/2019 FINDINGS: Tracheostomy appropriately position. Dual lead pacer. Numerous leads and wires project over the chest. Right-sided chest tube/pleural drain. Small bilateral pleural effusions. No pneumothorax. Improved right-sided aeration with interstitial edema and basilar predominant airspace disease remaining. New or progressive left base airspace disease. IMPRESSION: Right-sided chest tube placement, without acute complication or pneumothorax. Persistent congestive heart failure with bilateral pleural effusions and airspace disease. The left base airspace disease is progressive compared to 05/21/2019. Electronically Signed   By: Abigail Miyamoto M.D.   On: 05/25/2019 07:41    Labs:  CBC: Recent Labs    05/20/19 0516 05/22/19 0732 05/25/19 0454 05/27/19 0602  WBC 14.7* 22.2* 8.3 8.7  HGB 8.2* 8.6* 7.9* 8.5*  HCT 27.2* 28.3* 27.1* 28.2*  PLT 466* 464* 368 421*    COAGS: No results for input(s): INR, APTT in the last 8760 hours.  BMP: Recent Labs    05/20/19 0516 05/22/19 0732 05/25/19 0454 05/27/19 0602  NA 144 142 143 144  K 5.1 4.1 3.5 3.8  CL 102 101 104 104  CO2 33* 30 31 31   GLUCOSE 128* 125* 98 115*  BUN 25* 19 18 15   CALCIUM 9.3 8.9 9.1 9.1  CREATININE 0.62 0.60* 0.51* 0.48*  GFRNONAA >60 >60 >60 >60  GFRAA >60 >60 >60 >60  Assessment and Plan:  Right empyema s/p right chest tube placement in IR 05/23/2019 by Dr. Loreta Ave. Right chest tube stable with approximately 250 cc purulent fluid in pleure-vac, tube to suction with (-) air leak. Further plans per [facility]- appreciate and agree with management. IR to follow.   Electronically Signed: Elwin Mocha, PA-C 05/28/2019, 3:55  PM   I spent a total of 25 Minutes at the the patient's bedside AND on the patient's hospital floor or unit, greater than 50% of which was counseling/coordinating care for right empyema s/p right chest tube placement.

## 2019-05-29 DIAGNOSIS — J449 Chronic obstructive pulmonary disease, unspecified: Secondary | ICD-10-CM | POA: Diagnosis not present

## 2019-05-29 DIAGNOSIS — I63512 Cerebral infarction due to unspecified occlusion or stenosis of left middle cerebral artery: Secondary | ICD-10-CM | POA: Diagnosis not present

## 2019-05-29 DIAGNOSIS — J9621 Acute and chronic respiratory failure with hypoxia: Secondary | ICD-10-CM | POA: Diagnosis not present

## 2019-05-29 DIAGNOSIS — I469 Cardiac arrest, cause unspecified: Secondary | ICD-10-CM | POA: Diagnosis not present

## 2019-05-29 LAB — AEROBIC/ANAEROBIC CULTURE W GRAM STAIN (SURGICAL/DEEP WOUND)

## 2019-05-29 NOTE — Progress Notes (Signed)
Pulmonary Critical Care Medicine [facility] [place]   PULMONARY CRITICAL CARE SERVICE  PROGRESS NOTE  Date of Service: 05/29/2019  IVERY Zimmerman  AUQ:[id number]  DOB: 01-23-1946   DOA: 05/19/2019  Referring Physician: Carron Curie, MD  HPI: Garrett Zimmerman is a 74 y.o. male seen for follow up of Acute on Chronic Respiratory Failure.  Patient currently is on T collar with a goal of about 4 hours and resting on pressure support  Medications: Reviewed on Rounds  Physical Exam:  Vitals: Temperature 97.4 pulse 83 respiratory 26 blood pressure is 125/66 saturations 95%  Ventilator Settings on pressure support right now 30% FiO2 pressure 12 PEEP 5  . General: Comfortable at this time . Eyes: Grossly normal lids, irises & conjunctiva . ENT: grossly tongue is normal . Neck: no obvious mass . Cardiovascular: S1 S2 normal no gallop . Respiratory: No rhonchi no rales are noted at this time . Abdomen: soft . Skin: no rash seen on limited exam . Musculoskeletal: not rigid . Psychiatric:unable to assess . Neurologic: no seizure no involuntary movements         Lab Data:   Basic Metabolic Panel: Recent Labs  Lab 05/25/19 0454 05/27/19 0602  NA 143 144  K 3.5 3.8  CL 104 104  CO2 31 31  GLUCOSE 98 115*  BUN 18 15  CREATININE 0.51* 0.48*  CALCIUM 9.1 9.1  MG  --  1.7    ABG: No results for input(s): PHART, PCO2ART, PO2ART, HCO3, O2SAT in the last 168 hours.  Liver Function Tests: No results for input(s): AST, ALT, ALKPHOS, BILITOT, PROT, ALBUMIN in the last 168 hours. No results for input(s): LIPASE, AMYLASE in the last 168 hours. No results for input(s): AMMONIA in the last 168 hours.  CBC: Recent Labs  Lab 05/25/19 0454 05/27/19 0602  WBC 8.3 8.7  HGB 7.9* 8.5*  HCT 27.1* 28.2*  MCV 91.9 89.8  PLT 368 421*    Cardiac Enzymes: No results for input(s): CKTOTAL, CKMB, CKMBINDEX, TROPONINI in the last 168 hours.  BNP (last 3 results) No results for  input(s): BNP in the last 8760 hours.  ProBNP (last 3 results) No results for input(s): PROBNP in the last 8760 hours.  Radiological Exams: No results found.  Assessment/Plan Active Problems:   Acute on chronic respiratory failure with hypoxia (HCC)   Ventilator associated pneumonia (HCC)   COPD, severe (HCC)   Left acute arterial ischemic stroke, MCA (middle cerebral artery) (HCC)   Cardiac arrest (HCC)   1. Acute on chronic respiratory failure with hypoxia plan is to continue with weaning on T collar as tolerated rest on pressure support. 2. Ventilator associated pneumonia treated clinically. 3. Severe COPD at baseline we will continue to follow 4. Acute stroke no change in therapy as tolerated 5. Cardiac arrest rhythm is stable we will monitor the   I have personally seen and evaluated the patient, evaluated laboratory and imaging results, formulated the assessment and plan and placed orders. The Patient requires high complexity decision making with multiple systems involvement.  Rounds were done with the Respiratory Therapy Director and Staff therapists and discussed with nursing staff also.  [name], MD [facility] Pulmonary Critical Care Medicine Sleep Medicine

## 2019-05-30 DIAGNOSIS — J449 Chronic obstructive pulmonary disease, unspecified: Secondary | ICD-10-CM | POA: Diagnosis not present

## 2019-05-30 DIAGNOSIS — J9621 Acute and chronic respiratory failure with hypoxia: Secondary | ICD-10-CM | POA: Diagnosis not present

## 2019-05-30 DIAGNOSIS — I469 Cardiac arrest, cause unspecified: Secondary | ICD-10-CM | POA: Diagnosis not present

## 2019-05-30 DIAGNOSIS — I63512 Cerebral infarction due to unspecified occlusion or stenosis of left middle cerebral artery: Secondary | ICD-10-CM | POA: Diagnosis not present

## 2019-05-30 MED ORDER — GENERIC EXTERNAL MEDICATION
Status: DC
Start: ? — End: 2019-05-30

## 2019-05-30 NOTE — Progress Notes (Addendum)
Pulmonary Critical Care Medicine [facility] [place]   PULMONARY CRITICAL CARE SERVICE  PROGRESS NOTE  Date of Service: 05/30/2019  AYO SMOAK  [id number]  DOB: [birth year]   DOA: 05/19/2019  Referring Physician: Carron Curie, MD  HPI: RAZI HICKLE is a 74 y.o. male seen for follow up of Acute on Chronic Respiratory Failure.  Patient has an 8-hour goal today on aerosol trach collar currently on 35% FiO2 satting well no distress.  Medications: Reviewed on Rounds  Physical Exam:  Vitals: Pulse 89 respirations 26 BP 111/61 O2 sat 95% temp 98.0  Ventilator Settings ATC 35%  . General: Comfortable at this time . Eyes: Grossly normal lids, irises & conjunctiva . ENT: grossly tongue is normal . Neck: no obvious mass . Cardiovascular: S1 S2 normal no gallop . Respiratory: No rales or rhonchi noted . Abdomen: soft . Skin: no rash seen on limited exam . Musculoskeletal: not rigid . Psychiatric:unable to assess . Neurologic: no seizure no involuntary movements         Lab Data:   Basic Metabolic Panel: Recent Labs  Lab 05/25/19 0454 05/27/19 0602  NA 143 144  K 3.5 3.8  CL 104 104  CO2 31 31  GLUCOSE 98 115*  BUN 18 15  CREATININE 0.51* 0.48*  CALCIUM 9.1 9.1  MG  --  1.7    ABG: No results for input(s): PHART, PCO2ART, PO2ART, HCO3, O2SAT in the last 168 hours.  Liver Function Tests: No results for input(s): AST, ALT, ALKPHOS, BILITOT, PROT, ALBUMIN in the last 168 hours. No results for input(s): LIPASE, AMYLASE in the last 168 hours. No results for input(s): AMMONIA in the last 168 hours.  CBC: Recent Labs  Lab 05/25/19 0454 05/27/19 0602  WBC 8.3 8.7  HGB 7.9* 8.5*  HCT 27.1* 28.2*  MCV 91.9 89.8  PLT 368 421*    Cardiac Enzymes: No results for input(s): CKTOTAL, CKMB, CKMBINDEX, TROPONINI in the last 168 hours.  BNP (last 3 results) No results for input(s): BNP in the last 8760 hours.  ProBNP (last 3 results) No results for  input(s): PROBNP in the last 8760 hours.  Radiological Exams: No results found.  Assessment/Plan Active Problems:   Acute on chronic respiratory failure with hypoxia (HCC)   Ventilator associated pneumonia (HCC)   COPD, severe (HCC)   Left acute arterial ischemic stroke, MCA (middle cerebral artery) (HCC)   Cardiac arrest (HCC)   1. Acute on chronic respiratory failure with hypoxia plan is to continue with weaning on T collar as tolerated rest on pressure support. 2. Ventilator associated pneumonia treated clinically. 3. Severe COPD at baseline we will continue to follow 4. Acute stroke no change in therapy as tolerated 5. Cardiac arrest rhythm is stable we will monitor the    I have personally seen and evaluated the patient, evaluated laboratory and imaging results, formulated the assessment and plan and placed orders. The Patient requires high complexity decision making with multiple systems involvement.  Rounds were done with the Respiratory Therapy Director and Staff therapists and discussed with nursing staff also.  [name], MD [facility] Pulmonary Critical Care Medicine Sleep Medicine

## 2019-05-31 ENCOUNTER — Other Ambulatory Visit (HOSPITAL_COMMUNITY): Payer: Medicare Other

## 2019-05-31 DIAGNOSIS — I469 Cardiac arrest, cause unspecified: Secondary | ICD-10-CM | POA: Diagnosis not present

## 2019-05-31 DIAGNOSIS — J9621 Acute and chronic respiratory failure with hypoxia: Secondary | ICD-10-CM | POA: Diagnosis not present

## 2019-05-31 DIAGNOSIS — J449 Chronic obstructive pulmonary disease, unspecified: Secondary | ICD-10-CM | POA: Diagnosis not present

## 2019-05-31 DIAGNOSIS — I63512 Cerebral infarction due to unspecified occlusion or stenosis of left middle cerebral artery: Secondary | ICD-10-CM | POA: Diagnosis not present

## 2019-05-31 LAB — CBC
HCT: 31.7 % — ABNORMAL LOW (ref 39.0–52.0)
Hemoglobin: 9.4 g/dL — ABNORMAL LOW (ref 13.0–17.0)
MCH: 27.2 pg (ref 26.0–34.0)
MCHC: 29.7 g/dL — ABNORMAL LOW (ref 30.0–36.0)
MCV: 91.6 fL (ref 80.0–100.0)
Platelets: 332 10*3/uL (ref 150–400)
RBC: 3.46 MIL/uL — ABNORMAL LOW (ref 4.22–5.81)
RDW: 18.3 % — ABNORMAL HIGH (ref 11.5–15.5)
WBC: 13.4 10*3/uL — ABNORMAL HIGH (ref 4.0–10.5)
nRBC: 0 % (ref 0.0–0.2)

## 2019-05-31 LAB — BASIC METABOLIC PANEL
Anion gap: 13 (ref 5–15)
BUN: 13 mg/dL (ref 8–23)
CO2: 26 mmol/L (ref 22–32)
Calcium: 9.2 mg/dL (ref 8.9–10.3)
Chloride: 103 mmol/L (ref 98–111)
Creatinine, Ser: 0.38 mg/dL — ABNORMAL LOW (ref 0.61–1.24)
GFR calc Af Amer: >60 mL/min (ref >60)
GFR calc non Af Amer: >60 mL/min (ref >60)
Glucose, Bld: 132 mg/dL — ABNORMAL HIGH (ref 70–99)
Potassium: 4.8 mmol/L (ref 3.5–5.1)
Sodium: 142 mmol/L (ref 135–145)

## 2019-05-31 LAB — MAGNESIUM: Magnesium: 1.9 mg/dL (ref 1.7–2.4)

## 2019-05-31 MED ORDER — GENERIC EXTERNAL MEDICATION
Status: DC
Start: ? — End: 2019-05-31

## 2019-05-31 NOTE — Progress Notes (Addendum)
Pulmonary Critical Care Medicine [facility] [place]   PULMONARY CRITICAL CARE SERVICE  PROGRESS NOTE  Date of Service: 05/31/2019  Garrett Zimmerman  [id number]  DOB: 09-18-1945   DOA: 05/19/2019  Referring Physician: Carron Curie, MD  HPI: Garrett Zimmerman is a 74 y.o. male seen for follow up of Acute on Chronic Respiratory Failure.  Patient right now is on T collar has been on 35% FiO2 the goal is 12 hours  Medications: Reviewed on Rounds  Physical Exam:  Vitals: Temperature is 97.2 pulse 94 respiratory 25 blood pressure is 130/59 saturations 94%  Ventilator Settings on T collar FiO2 35% 12-hour goal  . General: Comfortable at this time . Eyes: Grossly normal lids, irises & conjunctiva . ENT: grossly tongue is normal . Neck: no obvious mass . Cardiovascular: S1 S2 normal no gallop . Respiratory: No rhonchi no rales are noted at this time . Abdomen: soft . Skin: no rash seen on limited exam . Musculoskeletal: not rigid . Psychiatric:unable to assess . Neurologic: no seizure no involuntary movements         Lab Data:   Basic Metabolic Panel: Recent Labs  Lab 05/25/19 0454 05/27/19 0602 05/31/19 0731  NA 143 144 142  K 3.5 3.8 4.8  CL 104 104 103  CO2 31 31 26   GLUCOSE 98 115* 132*  BUN 18 15 13   CREATININE 0.51* 0.48* 0.38*  CALCIUM 9.1 9.1 9.2  MG  --  1.7 1.9    ABG: No results for input(s): PHART, PCO2ART, PO2ART, HCO3, O2SAT in the last 168 hours.  Liver Function Tests: No results for input(s): AST, ALT, ALKPHOS, BILITOT, PROT, ALBUMIN in the last 168 hours. No results for input(s): LIPASE, AMYLASE in the last 168 hours. No results for input(s): AMMONIA in the last 168 hours.  CBC: Recent Labs  Lab 05/25/19 0454 05/27/19 0602 05/31/19 0731  WBC 8.3 8.7 13.4*  HGB 7.9* 8.5* 9.4*  HCT 27.1* 28.2* 31.7*  MCV 91.9 89.8 91.6  PLT 368 421* 332    Cardiac Enzymes: No results for input(s): CKTOTAL, CKMB, CKMBINDEX, TROPONINI in the  last 168 hours.  BNP (last 3 results) No results for input(s): BNP in the last 8760 hours.  ProBNP (last 3 results) No results for input(s): PROBNP in the last 8760 hours.  Radiological Exams: DG Chest Port 1 View  Result Date: 05/31/2019 CLINICAL DATA:  Empyema right pleural space. EXAM: PORTABLE CHEST 1 VIEW COMPARISON:  May 26, 2019 FINDINGS: The ETT is in good position. Stable pacemaker. Stable cardiomediastinal silhouette. New opacity in left base. Stable right pleural drain. Increasing opacity in the right base, more focal. No other acute abnormalities. IMPRESSION: 1. Support apparatus as above. 2. New opacity in left base worrisome for pneumonia or aspiration. 3. Increasing opacity in the right base, more focal in the interval, may represent increasing loculated pleural fluid given the history of empyema. Pulmonary infiltrate not excluded based on this AP portable film. Electronically Signed   By: 06/02/2019 III M.D   On: 05/31/2019 14:52    Assessment/Plan Active Problems:   Acute on chronic respiratory failure with hypoxia (HCC)   Ventilator associated pneumonia (HCC)   COPD, severe (HCC)   Left acute arterial ischemic stroke, MCA (middle cerebral artery) (HCC)   Cardiac arrest (HCC)   1. Acute on chronic respiratory failure hypoxia plan is to continue with T collar trials titrate oxygen continue pulmonary toilet. 2. Ventilator associated pneumonia treated clinically is improving still  has some new areas of infiltrate 3. Severe COPD at baseline medical management 4. Left acute stroke no change supportive care 5. Cardiac arrest rhythm has been stable   I have personally seen and evaluated the patient, evaluated laboratory and imaging results, formulated the assessment and plan and placed orders. The Patient requires high complexity decision making with multiple systems involvement.  Rounds were done with the Respiratory Therapy Director and Staff therapists and  discussed with nursing staff also.  Allyne Gee, MD [facility] Pulmonary Critical Care Medicine Sleep Medicine

## 2019-05-31 NOTE — Progress Notes (Signed)
    Referring Physician(s): Brown,S  Supervising Physician: Malachy Moan  Patient Status:  [facility] IP  Chief Complaint: Right chest empyema   Subjective: Patient awake /alert, trach in place, son at bedside; no acute changes   Allergies: Patient has no allergy information on record.  Medications: Prior to Admission medications   Not on File     Vital Signs: BP 135/75   Pulse (!) 101   SpO2 94%   Physical Exam patient awake/alert; right chest tube in place; purulent beige-colored fluid in chest tube, no obvious air leak, tube to wall suction; output has not been accurately recorded but appears to be approximately 400 cc output since placement on the 16th  Imaging: No results found.  Labs:  CBC: Recent Labs    05/22/19 0732 05/25/19 0454 05/27/19 0602 05/31/19 0731  WBC 22.2* 8.3 8.7 13.4*  HGB 8.6* 7.9* 8.5* 9.4*  HCT 28.3* 27.1* 28.2* 31.7*  PLT 464* 368 421* 332    COAGS: No results for input(s): INR, APTT in the last 8760 hours.  BMP: Recent Labs    05/22/19 0732 05/25/19 0454 05/27/19 0602 05/31/19 0731  NA 142 143 144 142  K 4.1 3.5 3.8 4.8  CL 101 104 104 103  CO2 30 31 31 26   GLUCOSE 125* 98 115* 132*  BUN 19 18 15 13   CALCIUM 8.9 9.1 9.1 9.2  CREATININE 0.60* 0.51* 0.48* 0.38*  GFRNONAA >60 >60 >60 >60  GFRAA >60 >60 >60 >60    LIVER FUNCTION TESTS: No results for input(s): BILITOT, AST, ALT, ALKPHOS, PROT, ALBUMIN in the last 8760 hours.  Assessment and Plan: Right empyema s/p right chest tube placement in IR 05/23/2019 by Dr. ; CXR today pending; WBC 13.4, hemoglobin 9.4, creatinine 0.38, pleural fluid cultures with E. coli and Enterococcus; recommend follow-up CT chest next week to reassess adequacy of empyema drainage   Electronically Signed: D. 05/25/2019, PA-C 05/31/2019, 1:57 PM   I spent a total of 15 minutes at the the patient's bedside AND on the patient's hospital floor or unit, greater than 50% of which was  counseling/coordinating care for right chest drain    Patient ID: Garrett Zimmerman, male   DOB: 01-04-1946, 74 y.o.   MRN: [id number]

## 2019-06-01 DIAGNOSIS — I63512 Cerebral infarction due to unspecified occlusion or stenosis of left middle cerebral artery: Secondary | ICD-10-CM | POA: Diagnosis not present

## 2019-06-01 DIAGNOSIS — J449 Chronic obstructive pulmonary disease, unspecified: Secondary | ICD-10-CM | POA: Diagnosis not present

## 2019-06-01 DIAGNOSIS — I469 Cardiac arrest, cause unspecified: Secondary | ICD-10-CM | POA: Diagnosis not present

## 2019-06-01 DIAGNOSIS — J9621 Acute and chronic respiratory failure with hypoxia: Secondary | ICD-10-CM | POA: Diagnosis not present

## 2019-06-01 NOTE — Progress Notes (Signed)
Pulmonary Critical Care Medicine [facility] [place]   PULMONARY CRITICAL CARE SERVICE  PROGRESS NOTE  Date of Service: 06/01/2019  [name]  KVQ:[id number]  DOB: 1945-06-02   DOA: 05/19/2019  Referring Physician: Carron Curie, MD  HPI: Garrett Zimmerman is a 74 y.o. male seen for follow up of Acute on Chronic Respiratory Failure.  Patient has been weaning on T collar right now requiring 30% FiO2 yesterday patient was able to do 12 hours today we will go for a goal of 16 hours  Medications: Reviewed on Rounds  Physical Exam:  Vitals: Temperature 96.5 pulse 102 respiratory rate 26 blood pressure is 154/89 saturations 99%  Ventilator Settings on T collar FiO2 30%  . General: Comfortable at this time . Eyes: Grossly normal lids, irises & conjunctiva . ENT: grossly tongue is normal . Neck: no obvious mass . Cardiovascular: S1 S2 normal no gallop . Respiratory: No rhonchi no rales noted at this time . Abdomen: soft . Skin: no rash seen on limited exam . Musculoskeletal: not rigid . Psychiatric:unable to assess . Neurologic: no seizure no involuntary movements         Lab Data:   Basic Metabolic Panel: Recent Labs  Lab 05/27/19 0602 05/31/19 0731  NA 144 142  K 3.8 4.8  CL 104 103  CO2 31 26  GLUCOSE 115* 132*  BUN 15 13  CREATININE 0.48* 0.38*  CALCIUM 9.1 9.2  MG 1.7 1.9    ABG: No results for input(s): PHART, PCO2ART, PO2ART, HCO3, O2SAT in the last 168 hours.  Liver Function Tests: No results for input(s): AST, ALT, ALKPHOS, BILITOT, PROT, ALBUMIN in the last 168 hours. No results for input(s): LIPASE, AMYLASE in the last 168 hours. No results for input(s): AMMONIA in the last 168 hours.  CBC: Recent Labs  Lab 05/27/19 0602 05/31/19 0731  WBC 8.7 13.4*  HGB 8.5* 9.4*  HCT 28.2* 31.7*  MCV 89.8 91.6  PLT 421* 332    Cardiac Enzymes: No results for input(s): CKTOTAL, CKMB, CKMBINDEX, TROPONINI in the last 168 hours.  BNP (last 3  results) No results for input(s): BNP in the last 8760 hours.  ProBNP (last 3 results) No results for input(s): PROBNP in the last 8760 hours.  Radiological Exams: DG Chest Port 1 View  Result Date: 05/31/2019 CLINICAL DATA:  Empyema right pleural space. EXAM: PORTABLE CHEST 1 VIEW COMPARISON:  May 26, 2019 FINDINGS: The ETT is in good position. Stable pacemaker. Stable cardiomediastinal silhouette. New opacity in left base. Stable right pleural drain. Increasing opacity in the right base, more focal. No other acute abnormalities. IMPRESSION: 1. Support apparatus as above. 2. New opacity in left base worrisome for pneumonia or aspiration. 3. Increasing opacity in the right base, more focal in the interval, may represent increasing loculated pleural fluid given the history of empyema. Pulmonary infiltrate not excluded based on this AP portable film. Electronically Signed   By: Gerome Sam III M.D   On: 05/31/2019 14:52    Assessment/Plan Active Problems:   Acute on chronic respiratory failure with hypoxia (HCC)   Ventilator associated pneumonia (HCC)   COPD, severe (HCC)   Left acute arterial ischemic stroke, MCA (middle cerebral artery) (HCC)   Cardiac arrest (HCC)   1. Acute on chronic respiratory failure hypoxia we will continue to wean on T collar currently on 30% FiO2 goal of 16 hours 2. Ventilator associated pneumonia treated clinically improved 3. Severe COPD medical management we will continue with  supportive care 4. Left acute stroke MCA territory supportive care therapy as tolerated 5. Cardiac arrest rhythm has been stable   I have personally seen and evaluated the patient, evaluated laboratory and imaging results, formulated the assessment and plan and placed orders. The Patient requires high complexity decision making with multiple systems involvement.  Rounds were done with the Respiratory Therapy Director and Staff therapists and discussed with nursing staff  also.  Allyne Gee, MD [facility] Pulmonary Critical Care Medicine Sleep Medicine

## 2019-06-02 DIAGNOSIS — J9621 Acute and chronic respiratory failure with hypoxia: Secondary | ICD-10-CM | POA: Diagnosis not present

## 2019-06-02 DIAGNOSIS — J449 Chronic obstructive pulmonary disease, unspecified: Secondary | ICD-10-CM | POA: Diagnosis not present

## 2019-06-02 DIAGNOSIS — I469 Cardiac arrest, cause unspecified: Secondary | ICD-10-CM | POA: Diagnosis not present

## 2019-06-02 DIAGNOSIS — I63512 Cerebral infarction due to unspecified occlusion or stenosis of left middle cerebral artery: Secondary | ICD-10-CM | POA: Diagnosis not present

## 2019-06-02 MED ORDER — GENERIC EXTERNAL MEDICATION
Status: DC
Start: ? — End: 2019-06-02

## 2019-06-02 NOTE — Progress Notes (Signed)
    Referring Physician(s): Manson Passey, Kathie Rhodes.  Supervising Physician: Simonne Come  Patient Status:  [facility] IP  Chief Complaint:  Right Chest empyema  Subjective:  Patient is awake, trach in place with trach collar. No acute changes.   Allergies: Patient has no allergy information on record.  Medications: Prior to Admission medications   Not on File     Vital Signs: BP 135/75   Pulse (!) 101   SpO2 94%   Physical Exam: Patient awake/alert; follows commands intermittently, right chest tube in place; purulent beige-colored fluid in chest tube, no obvious air leak, tube to wall suction; approximately 60 cc in the collection container.  Imaging: DG Chest Port 1 View  Result Date: 05/31/2019 CLINICAL DATA:  Empyema right pleural space. EXAM: PORTABLE CHEST 1 VIEW COMPARISON:  May 26, 2019 FINDINGS: The ETT is in good position. Stable pacemaker. Stable cardiomediastinal silhouette. New opacity in left base. Stable right pleural drain. Increasing opacity in the right base, more focal. No other acute abnormalities. IMPRESSION: 1. Support apparatus as above. 2. New opacity in left base worrisome for pneumonia or aspiration. 3. Increasing opacity in the right base, more focal in the interval, may represent increasing loculated pleural fluid given the history of empyema. Pulmonary infiltrate not excluded based on this AP portable film. Electronically Signed   By: Gerome Sam III M.D   On: 05/31/2019 14:52    Labs:  CBC: Recent Labs    05/22/19 0732 05/25/19 0454 05/27/19 0602 05/31/19 0731  WBC 22.2* 8.3 8.7 13.4*  HGB 8.6* 7.9* 8.5* 9.4*  HCT 28.3* 27.1* 28.2* 31.7*  PLT 464* 368 421* 332    COAGS: No results for input(s): INR, APTT in the last 8760 hours.  BMP: Recent Labs    05/22/19 0732 05/25/19 0454 05/27/19 0602 05/31/19 0731  NA 142 143 144 142  K 4.1 3.5 3.8 4.8  CL 101 104 104 103  CO2 30 31 31 26   GLUCOSE 125* 98 115* 132*  BUN 19 18 15 13   CALCIUM 8.9  9.1 9.1 9.2  CREATININE 0.60* 0.51* 0.48* 0.38*  GFRNONAA >60 >60 >60 >60  GFRAA >60 >60 >60 >60    LIVER FUNCTION TESTS: No results for input(s): BILITOT, AST, ALT, ALKPHOS, PROT, ALBUMIN in the last 8760 hours.  Assessment and Plan:  Right empyema, status post right chest tube placement in IR 05/23/2019 by Dr. ; Last CXR on 05/31/2019: Increasing opacity in the right base, more focal in the interval, may represent increasing loculated pleural fluid given the history of empyema. Pulmonary infiltrate not excluded based on this AP portable film.  Labs from 05/31/2019: WBC 13.4, hemoglobin 9.4, creatinine 0.38, pleural fluid cultures with E. coli and Enterococcus; recommend follow-up CT chest next week to reassess adequacy of empyema drainage.  Electronically Signed: 06/02/2019, NP 06/02/2019, 2:54 PM   I spent a total of 15 Minutes at the the patient's bedside AND on the patient's hospital floor or unit, greater than 50% of which was counseling/coordinating care for right chest tube.

## 2019-06-02 NOTE — Progress Notes (Signed)
Pulmonary Critical Care Medicine [facility] [place]   PULMONARY CRITICAL CARE SERVICE  PROGRESS NOTE  Date of Service: 06/02/2019  Garrett Zimmerman  OVZ:[id number]  DOB: Sep 04, 1945   DOA: 05/19/2019  Referring Physician: Carron Curie, MD  HPI: Garrett Zimmerman is a 74 y.o. male seen for follow up of Acute on Chronic Respiratory Failure.  Patient currently is on T collar has been on 30% FiO2 today's goal is for 20 hours  Medications: Reviewed on Rounds  Physical Exam:  Vitals: Temperature 98.1 pulse 91 respiratory rate 30 blood pressure is 104/58 saturations 96%  Ventilator Settings on T collar with an FiO2 of 30%  . General: Comfortable at this time . Eyes: Grossly normal lids, irises & conjunctiva . ENT: grossly tongue is normal . Neck: no obvious mass . Cardiovascular: S1 S2 normal no gallop . Respiratory: No rhonchi coarse breath sounds . Abdomen: soft . Skin: no rash seen on limited exam . Musculoskeletal: not rigid . Psychiatric:unable to assess . Neurologic: no seizure no involuntary movements         Lab Data:   Basic Metabolic Panel: Recent Labs  Lab 05/27/19 0602 05/31/19 0731  NA 144 142  K 3.8 4.8  CL 104 103  CO2 31 26  GLUCOSE 115* 132*  BUN 15 13  CREATININE 0.48* 0.38*  CALCIUM 9.1 9.2  MG 1.7 1.9    ABG: No results for input(s): PHART, PCO2ART, PO2ART, HCO3, O2SAT in the last 168 hours.  Liver Function Tests: No results for input(s): AST, ALT, ALKPHOS, BILITOT, PROT, ALBUMIN in the last 168 hours. No results for input(s): LIPASE, AMYLASE in the last 168 hours. No results for input(s): AMMONIA in the last 168 hours.  CBC: Recent Labs  Lab 05/27/19 0602 05/31/19 0731  WBC 8.7 13.4*  HGB 8.5* 9.4*  HCT 28.2* 31.7*  MCV 89.8 91.6  PLT 421* 332    Cardiac Enzymes: No results for input(s): CKTOTAL, CKMB, CKMBINDEX, TROPONINI in the last 168 hours.  BNP (last 3 results) No results for input(s): BNP in the last 8760  hours.  ProBNP (last 3 results) No results for input(s): PROBNP in the last 8760 hours.  Radiological Exams: DG Chest Port 1 View  Result Date: 05/31/2019 CLINICAL DATA:  Empyema right pleural space. EXAM: PORTABLE CHEST 1 VIEW COMPARISON:  May 26, 2019 FINDINGS: The ETT is in good position. Stable pacemaker. Stable cardiomediastinal silhouette. New opacity in left base. Stable right pleural drain. Increasing opacity in the right base, more focal. No other acute abnormalities. IMPRESSION: 1. Support apparatus as above. 2. New opacity in left base worrisome for pneumonia or aspiration. 3. Increasing opacity in the right base, more focal in the interval, may represent increasing loculated pleural fluid given the history of empyema. Pulmonary infiltrate not excluded based on this AP portable film. Electronically Signed   By: Gerome Sam III M.D   On: 05/31/2019 14:52    Assessment/Plan Active Problems:   Acute on chronic respiratory failure with hypoxia (HCC)   Ventilator associated pneumonia (HCC)   COPD, severe (HCC)   Left acute arterial ischemic stroke, MCA (middle cerebral artery) (HCC)   Cardiac arrest (HCC)   1. Acute on chronic respiratory failure hypoxia we will continue with T collar trials patient remains on a goal of 20 hours today 2. Ventilator associated pneumonia treated there is new opacity will need to follow-up on this with chest x-ray clinically looks good 3. Severe COPD medical management 4. Left-sided stroke  no change supportive care 5. Cardiac arrest rhythm is stable at this time   I have personally seen and evaluated the patient, evaluated laboratory and imaging results, formulated the assessment and plan and placed orders. The Patient requires high complexity decision making with multiple systems involvement.  Rounds were done with the Respiratory Therapy Director and Staff therapists and discussed with nursing staff also.  Allyne Gee, MD [facility] Pulmonary  Critical Care Medicine Sleep Medicine

## 2019-06-03 ENCOUNTER — Other Ambulatory Visit (HOSPITAL_COMMUNITY): Payer: Medicare Other

## 2019-06-03 DIAGNOSIS — J9621 Acute and chronic respiratory failure with hypoxia: Secondary | ICD-10-CM | POA: Diagnosis not present

## 2019-06-03 DIAGNOSIS — J449 Chronic obstructive pulmonary disease, unspecified: Secondary | ICD-10-CM | POA: Diagnosis not present

## 2019-06-03 DIAGNOSIS — I469 Cardiac arrest, cause unspecified: Secondary | ICD-10-CM | POA: Diagnosis not present

## 2019-06-03 DIAGNOSIS — I63512 Cerebral infarction due to unspecified occlusion or stenosis of left middle cerebral artery: Secondary | ICD-10-CM | POA: Diagnosis not present

## 2019-06-03 NOTE — Progress Notes (Signed)
Pulmonary Critical Care Medicine [facility] [place]   PULMONARY CRITICAL CARE SERVICE  PROGRESS NOTE  Date of Service: 06/03/2019  Garrett Zimmerman  GXQ:[id number]  DOB: [birth year]   DOA: 05/19/2019  Referring Physician: Carron Curie, MD  HPI: Garrett Zimmerman is a 74 y.o. male seen for follow up of Acute on Chronic Respiratory Failure.  Patient at this time is on T collar has been on 30% FiO2 with a goal of 24 hours.  Appears to be tolerating it well  Medications: Reviewed on Rounds  Physical Exam:  Vitals: Temperature 97.5 pulse 107 respiratory rate 30 blood pressure is 168/68 saturations 95%  Ventilator Settings on T collar with an FiO2 of 30%  . General: Comfortable at this time . Eyes: Grossly normal lids, irises & conjunctiva . ENT: grossly tongue is normal . Neck: no obvious mass . Cardiovascular: S1 S2 normal no gallop . Respiratory: No rhonchi coarse breath sounds . Abdomen: soft . Skin: no rash seen on limited exam . Musculoskeletal: not rigid . Psychiatric:unable to assess . Neurologic: no seizure no involuntary movements         Lab Data:   Basic Metabolic Panel: Recent Labs  Lab 05/31/19 0731  NA 142  K 4.8  CL 103  CO2 26  GLUCOSE 132*  BUN 13  CREATININE 0.38*  CALCIUM 9.2  MG 1.9    ABG: No results for input(s): PHART, PCO2ART, PO2ART, HCO3, O2SAT in the last 168 hours.  Liver Function Tests: No results for input(s): AST, ALT, ALKPHOS, BILITOT, PROT, ALBUMIN in the last 168 hours. No results for input(s): LIPASE, AMYLASE in the last 168 hours. No results for input(s): AMMONIA in the last 168 hours.  CBC: Recent Labs  Lab 05/31/19 0731  WBC 13.4*  HGB 9.4*  HCT 31.7*  MCV 91.6  PLT 332    Cardiac Enzymes: No results for input(s): CKTOTAL, CKMB, CKMBINDEX, TROPONINI in the last 168 hours.  BNP (last 3 results) No results for input(s): BNP in the last 8760 hours.  ProBNP (last 3 results) No results for input(s): PROBNP  in the last 8760 hours.  Radiological Exams: No results found.  Assessment/Plan Active Problems:   Acute on chronic respiratory failure with hypoxia (HCC)   Ventilator associated pneumonia (HCC)   COPD, severe (HCC)   Left acute arterial ischemic stroke, MCA (middle cerebral artery) (HCC)   Cardiac arrest (HCC)   1. Acute on chronic respiratory failure hypoxia we will continue with T collar trials titrate oxygen as tolerated the goal today is for 24 hours. 2. Ventilator associated pneumonia treated resolved 3. Severe COPD at baseline 4. Left stroke no change we will continue with therapy 5. Cardiac arrest rhythm is stable   I have personally seen and evaluated the patient, evaluated laboratory and imaging results, formulated the assessment and plan and placed orders. The Patient requires high complexity decision making with multiple systems involvement.  Rounds were done with the Respiratory Therapy Director and Staff therapists and discussed with nursing staff also.  [name], MD [facility] Pulmonary Critical Care Medicine Sleep Medicine

## 2019-06-03 NOTE — Progress Notes (Signed)
    Referring Physician(s): Manson Passey, Kathie Rhodes.  Supervising Physician: Irish Lack  Patient Status:  [facility] IP  Chief Complaint: Right Chest Empyema  Subjective: Patient alert, awake, working with physical therapy. Trach/trach collar in place.   Allergies: Patient has no allergy information on record.  Medications: Prior to Admission medications   Not on File     Vital Signs: BP 135/75   Pulse (!) 101   SpO2 94%   Physical Exam Patient awake/alert;follows commands intermittently, right chest tube in place;purulent beige-colored fluid in chest tube, no obvious air leak,tube to wall suction;approximately 100 cc (+40 from 06/02/2019) in the collection container.  Imaging: DG Chest Port 1 View  Result Date: 05/31/2019 CLINICAL DATA:  Empyema right pleural space. EXAM: PORTABLE CHEST 1 VIEW COMPARISON:  May 26, 2019 FINDINGS: The ETT is in good position. Stable pacemaker. Stable cardiomediastinal silhouette. New opacity in left base. Stable right pleural drain. Increasing opacity in the right base, more focal. No other acute abnormalities. IMPRESSION: 1. Support apparatus as above. 2. New opacity in left base worrisome for pneumonia or aspiration. 3. Increasing opacity in the right base, more focal in the interval, may represent increasing loculated pleural fluid given the history of empyema. Pulmonary infiltrate not excluded based on this AP portable film. Electronically Signed   By: Gerome Sam III M.D   On: 05/31/2019 14:52    Labs:  CBC: Recent Labs    05/22/19 0732 05/25/19 0454 05/27/19 0602 05/31/19 0731  WBC 22.2* 8.3 8.7 13.4*  HGB 8.6* 7.9* 8.5* 9.4*  HCT 28.3* 27.1* 28.2* 31.7*  PLT 464* 368 421* 332    COAGS: No results for input(s): INR, APTT in the last 8760 hours.  BMP: Recent Labs    05/22/19 0732 05/25/19 0454 05/27/19 0602 05/31/19 0731  NA 142 143 144 142  K 4.1 3.5 3.8 4.8  CL 101 104 104 103  CO2 30 31 31 26   GLUCOSE 125* 98 115* 132*    BUN 19 18 15 13   CALCIUM 8.9 9.1 9.1 9.2  CREATININE 0.60* 0.51* 0.48* 0.38*  GFRNONAA >60 >60 >60 >60  GFRAA >60 >60 >60 >60    LIVER FUNCTION TESTS: No results for input(s): BILITOT, AST, ALT, ALKPHOS, PROT, ALBUMIN in the last 8760 hours.  Assessment and Plan:  Right empyema, status post right chest tube placement in IR 05/23/2019 by Dr. ; Last CXR on 05/31/2019: Increasing opacity in the right base, more focal in the interval, may represent increasing loculated pleural fluid given the history of empyema. Pulmonary infiltrate not excluded based on this AP portable film.  Labs from 05/31/2019: WBC 13.4, hemoglobin 9.4,creatinine 0.38,pleural fluid cultures with E. coli and Enterococcus;recommend follow-up CT chest next week to reassess adequacy of empyema drainage  Electronically Signed: 06/02/2019, NP 06/03/2019, 9:39 AM   I spent a total of 15 Minutes at the the patient's bedside AND on the patient's hospital floor or unit, greater than 50% of which was counseling/coordinating care for right chest tube.

## 2019-06-04 DIAGNOSIS — I63512 Cerebral infarction due to unspecified occlusion or stenosis of left middle cerebral artery: Secondary | ICD-10-CM | POA: Diagnosis not present

## 2019-06-04 DIAGNOSIS — I469 Cardiac arrest, cause unspecified: Secondary | ICD-10-CM | POA: Diagnosis not present

## 2019-06-04 DIAGNOSIS — J449 Chronic obstructive pulmonary disease, unspecified: Secondary | ICD-10-CM | POA: Diagnosis not present

## 2019-06-04 DIAGNOSIS — J9621 Acute and chronic respiratory failure with hypoxia: Secondary | ICD-10-CM | POA: Diagnosis not present

## 2019-06-04 NOTE — Progress Notes (Signed)
    Referring Physician(s): S. Manson Passey  Supervising Physician: Gilmer Mor  Patient Status:  [facility] IP  Chief Complaint: Right Chest Empyema   Subjective: Patient awake, alert, minimally interactive with radiology team. Son at the bedside. No apparent discomfort or distress observed.  Allergies: Patient has no allergy information on record.  Medications: Prior to Admission medications   Not on File     Vital Signs: BP 135/75   Pulse (!) 101   SpO2 94%   Physical Exam Patient awake/alert;follows commands intermittently,right chest tube in place;purulent beige-colored fluid in chest tube, no obvious air leak,tube to wall suction;approximately 150 cc in the collection container.   Imaging: DG Chest Port 1 View  Result Date: 06/03/2019 CLINICAL DATA:  Respiratory failure and shortness of breath. EXAM: PORTABLE CHEST 1 VIEW COMPARISON:  Radiograph 05/31/2019. CT 05/22/2019 FINDINGS: Tracheostomy tube tip at the thoracic inlet. 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 Right pigtail catheter at the lung base with associated ill-defined pleuroparenchymal opacity. Probable fluid in the minor fissure which is improving from prior exam. No visualized pneumothorax. Patchy opacity at the left lung base unchanged. Unchanged heart size and mediastinal contours. Aortic atherosclerosis. IMPRESSION: 1. Right pigtail catheter at the lung base with associated ill-defined pleuroparenchymal opacity. No visualized pneumothorax. Probable fluid in the minor fissure, improved from prior exam. 2. No new abnormalities. Electronically Signed   By: Narda Rutherford M.D.   On: 06/03/2019 22:17    Labs:  CBC: Recent Labs    05/22/19 0732 05/25/19 0454 05/27/19 0602 05/31/19 0731  WBC 22.2* 8.3 8.7 13.4*  HGB 8.6* 7.9* 8.5* 9.4*  HCT 28.3* 27.1* 28.2* 31.7*  PLT 464* 368 421* 332    COAGS: No results for input(s): INR, APTT in the last 8760 hours.  BMP: Recent Labs    05/22/19 0732 05/25/19 0454  05/27/19 0602 05/31/19 0731  NA 142 143 144 142  K 4.1 3.5 3.8 4.8  CL 101 104 104 103  CO2 30 31 31 26   GLUCOSE 125* 98 115* 132*  BUN 19 18 15 13   CALCIUM 8.9 9.1 9.1 9.2  CREATININE 0.60* 0.51* 0.48* 0.38*  GFRNONAA >60 >60 >60 >60  GFRAA >60 >60 >60 >60    LIVER FUNCTION TESTS: No results for input(s): BILITOT, AST, ALT, ALKPHOS, PROT, ALBUMIN in the last 8760 hours.  Assessment and Plan:  Right empyema, status postright chest tube placement in IR 05/23/2019 by Dr. ; LastCXRon 06/03/2019:Right pigtail catheter at the lung base with associated ill-defined pleuroparenchymal opacity. No visualized pneumothorax. Probable fluid in the minor fissure, improved from prior exam.  Labs from 05/31/2019:WBC 13.4, hemoglobin 9.4,creatinine 0.38,pleural fluid cultures with E. coli and Enterococcus;recommend follow-up CT chest when appropriate to reassess adequacy of empyema drainage  Electronically Signed: 06/05/2019, NP 06/04/2019, 4:34 PM   I spent a total of 15 Minutes at the the patient's bedside AND on the patient's hospital floor or unit, greater than 50% of which was counseling/coordinating care for right chest tube.

## 2019-06-04 NOTE — Progress Notes (Signed)
Pulmonary Critical Care Medicine [facility] [place]   PULMONARY CRITICAL CARE SERVICE  PROGRESS NOTE  Date of Service: 06/04/2019  Garrett Zimmerman  WIO:[id number]  DOB: 10/29/1945   DOA: 05/19/2019  Referring Physician: Carron Curie, MD  HPI: Garrett Zimmerman is a 74 y.o. male seen for follow up of Acute on Chronic Respiratory Failure.  Currently on T collar has been on 30% FiO2 today will be 48 hours  Medications: Reviewed on Rounds  Physical Exam:  Vitals: Temperature is 97.0 pulse 103 respiratory 30 blood pressure is 133/77 saturations 93%  Ventilator Settings on T collar FiO2 30%  . General: Comfortable at this time . Eyes: Grossly normal lids, irises & conjunctiva . ENT: grossly tongue is normal . Neck: no obvious mass . Cardiovascular: S1 S2 normal no gallop . Respiratory: No rhonchi no rales noted at this time . Abdomen: soft . Skin: no rash seen on limited exam . Musculoskeletal: not rigid . Psychiatric:unable to assess . Neurologic: no seizure no involuntary movements         Lab Data:   Basic Metabolic Panel: Recent Labs  Lab 05/31/19 0731  NA 142  K 4.8  CL 103  CO2 26  GLUCOSE 132*  BUN 13  CREATININE 0.38*  CALCIUM 9.2  MG 1.9    ABG: No results for input(s): PHART, PCO2ART, PO2ART, HCO3, O2SAT in the last 168 hours.  Liver Function Tests: No results for input(s): AST, ALT, ALKPHOS, BILITOT, PROT, ALBUMIN in the last 168 hours. No results for input(s): LIPASE, AMYLASE in the last 168 hours. No results for input(s): AMMONIA in the last 168 hours.  CBC: Recent Labs  Lab 05/31/19 0731  WBC 13.4*  HGB 9.4*  HCT 31.7*  MCV 91.6  PLT 332    Cardiac Enzymes: No results for input(s): CKTOTAL, CKMB, CKMBINDEX, TROPONINI in the last 168 hours.  BNP (last 3 results) No results for input(s): BNP in the last 8760 hours.  ProBNP (last 3 results) No results for input(s): PROBNP in the last 8760 hours.  Radiological Exams: DG  Chest Port 1 View  Result Date: 06/03/2019 CLINICAL DATA:  Respiratory failure and shortness of breath. EXAM: PORTABLE CHEST 1 VIEW COMPARISON:  Radiograph 05/31/2019. CT 05/22/2019 FINDINGS: Tracheostomy tube tip at the thoracic inlet. 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 Right pigtail catheter at the lung base with associated ill-defined pleuroparenchymal opacity. Probable fluid in the minor fissure which is improving from prior exam. No visualized pneumothorax. Patchy opacity at the left lung base unchanged. Unchanged heart size and mediastinal contours. Aortic atherosclerosis. IMPRESSION: 1. Right pigtail catheter at the lung base with associated ill-defined pleuroparenchymal opacity. No visualized pneumothorax. Probable fluid in the minor fissure, improved from prior exam. 2. No new abnormalities. Electronically Signed   By: Narda Rutherford M.D.   On: 06/03/2019 22:17    Assessment/Plan Active Problems:   Acute on chronic respiratory failure with hypoxia (HCC)   Ventilator associated pneumonia (HCC)   COPD, severe (HCC)   Left acute arterial ischemic stroke, MCA (middle cerebral artery) (HCC)   Cardiac arrest (HCC)   1. Acute on chronic respiratory failure hypoxia we will continue with T collar trials today's 48 hours will be able to change the trach out in the morning 2. Ventilator associated pneumonia treated clinically is improving 3. Severe COPD medical management 4. Left ischemic stroke no change 5. Cardiac arrest vitals stable   I have personally seen and evaluated the patient, evaluated laboratory and imaging results, formulated  the assessment and plan and placed orders. The Patient requires high complexity decision making with multiple systems involvement.  Rounds were done with the Respiratory Therapy Director and Staff therapists and discussed with nursing staff also.  Allyne Gee, MD [facility] Pulmonary Critical Care Medicine Sleep Medicine

## 2019-06-05 DIAGNOSIS — I469 Cardiac arrest, cause unspecified: Secondary | ICD-10-CM | POA: Diagnosis not present

## 2019-06-05 DIAGNOSIS — J449 Chronic obstructive pulmonary disease, unspecified: Secondary | ICD-10-CM | POA: Diagnosis not present

## 2019-06-05 DIAGNOSIS — J9621 Acute and chronic respiratory failure with hypoxia: Secondary | ICD-10-CM | POA: Diagnosis not present

## 2019-06-05 DIAGNOSIS — I63512 Cerebral infarction due to unspecified occlusion or stenosis of left middle cerebral artery: Secondary | ICD-10-CM | POA: Diagnosis not present

## 2019-06-05 LAB — BASIC METABOLIC PANEL
Anion gap: 10 (ref 5–15)
BUN: 21 mg/dL (ref 8–23)
CO2: 32 mmol/L (ref 22–32)
Calcium: 9.6 mg/dL (ref 8.9–10.3)
Chloride: 102 mmol/L (ref 98–111)
Creatinine, Ser: 0.53 mg/dL — ABNORMAL LOW (ref 0.61–1.24)
GFR calc Af Amer: >60 mL/min (ref >60)
GFR calc non Af Amer: >60 mL/min (ref >60)
Glucose, Bld: 142 mg/dL — ABNORMAL HIGH (ref 70–99)
Potassium: 4.2 mmol/L (ref 3.5–5.1)
Sodium: 144 mmol/L (ref 135–145)

## 2019-06-05 LAB — CBC
HCT: 31.8 % — ABNORMAL LOW (ref 39.0–52.0)
Hemoglobin: 9.5 g/dL — ABNORMAL LOW (ref 13.0–17.0)
MCH: 26.8 pg (ref 26.0–34.0)
MCHC: 29.9 g/dL — ABNORMAL LOW (ref 30.0–36.0)
MCV: 89.8 fL (ref 80.0–100.0)
Platelets: 362 10*3/uL (ref 150–400)
RBC: 3.54 MIL/uL — ABNORMAL LOW (ref 4.22–5.81)
RDW: 19 % — ABNORMAL HIGH (ref 11.5–15.5)
WBC: 11.2 10*3/uL — ABNORMAL HIGH (ref 4.0–10.5)
nRBC: 0 % (ref 0.0–0.2)

## 2019-06-05 LAB — BLOOD GAS, ARTERIAL
Acid-Base Excess: 9.9 mmol/L — ABNORMAL HIGH (ref 0.0–2.0)
Bicarbonate: 34 mmol/L — ABNORMAL HIGH (ref 20.0–28.0)
FIO2: 28
O2 Saturation: 96.1 %
Patient temperature: 36.8
pCO2 arterial: 46.5 mmHg (ref 32.0–48.0)
pH, Arterial: 7.477 — ABNORMAL HIGH (ref 7.350–7.450)
pO2, Arterial: 79.2 mmHg — ABNORMAL LOW (ref 83.0–108.0)

## 2019-06-05 NOTE — Progress Notes (Signed)
Pulmonary Critical Care Medicine [place]   PULMONARY CRITICAL CARE SERVICE  PROGRESS NOTE  Date of Service: 06/05/2019  NOTNAMED SCHOLZ  NIO:[id number]  DOB: 1945-12-21   DOA: 05/19/2019  Referring Physician: Merton Border, MD  HPI: Garrett Zimmerman is a 74 y.o. male seen for follow up of Acute on Chronic Respiratory Failure.  Patient currently is on T collar has been on 28% FiO2 today will be 48 hours  Medications: Reviewed on Rounds  Physical Exam:  Vitals: Temperature is 97.2 pulse 100 respiratory 24 blood pressure is 120/60 saturations 94%  Ventilator Settings off the ventilator on T collar currently on 28% FiO2  . General: Comfortable at this time . Eyes: Grossly normal lids, irises & conjunctiva . ENT: grossly tongue is normal . Neck: no obvious mass . Cardiovascular: S1 S2 normal no gallop . Respiratory: No rhonchi coarse breath sounds . Abdomen: soft . Skin: no rash seen on limited exam . Musculoskeletal: not rigid . Psychiatric:unable to assess . Neurologic: no seizure no involuntary movements         Lab Data:   Basic Metabolic Panel: Recent Labs  Lab 05/31/19 0731 06/05/19 0354  NA 142 144  K 4.8 4.2  CL 103 102  CO2 26 32  GLUCOSE 132* 142*  BUN 13 21  CREATININE 0.38* 0.53*  CALCIUM 9.2 9.6  MG 1.9  --     ABG: No results for input(s): PHART, PCO2ART, PO2ART, HCO3, O2SAT in the last 168 hours.  Liver Function Tests: No results for input(s): AST, ALT, ALKPHOS, BILITOT, PROT, ALBUMIN in the last 168 hours. No results for input(s): LIPASE, AMYLASE in the last 168 hours. No results for input(s): AMMONIA in the last 168 hours.  CBC: Recent Labs  Lab 05/31/19 0731 06/05/19 0354  WBC 13.4* 11.2*  HGB 9.4* 9.5*  HCT 31.7* 31.8*  MCV 91.6 89.8  PLT 332 362    Cardiac Enzymes: No results for input(s): CKTOTAL, CKMB, CKMBINDEX, TROPONINI in the last 168 hours.  BNP (last 3 results) No results for input(s): BNP in the  last 8760 hours.  ProBNP (last 3 results) No results for input(s): PROBNP in the last 8760 hours.  Radiological Exams: DG Chest Port 1 View  Result Date: 06/03/2019 CLINICAL DATA:  Respiratory failure and shortness of breath. EXAM: PORTABLE CHEST 1 VIEW COMPARISON:  Radiograph 05/31/2019. CT 05/22/2019 FINDINGS: Tracheostomy tube tip at the thoracic inlet. 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 Right pigtail catheter at the lung base with associated ill-defined pleuroparenchymal opacity. Probable fluid in the minor fissure which is improving from prior exam. No visualized pneumothorax. Patchy opacity at the left lung base unchanged. Unchanged heart size and mediastinal contours. Aortic atherosclerosis. IMPRESSION: 1. Right pigtail catheter at the lung base with associated ill-defined pleuroparenchymal opacity. No visualized pneumothorax. Probable fluid in the minor fissure, improved from prior exam. 2. No new abnormalities. Electronically Signed   By: Keith Rake M.D.   On: 06/03/2019 22:17    Assessment/Plan Active Problems:   Acute on chronic respiratory failure with hypoxia (HCC)   Ventilator associated pneumonia (HCC)   COPD, severe (HCC)   Left acute arterial ischemic stroke, MCA (middle cerebral artery) ([place])   Cardiac arrest ([place])   1. Acute on chronic respiratory failure with hypoxia we will continue with T collar right now the goal is for 48 hours 2. Ventilator associated pneumonia treated we will continue to follow 3. Severe COPD at baseline right now 4. Left acute stroke no  change 5. Cardiac arrest rhythm stable   I have personally seen and evaluated the patient, evaluated laboratory and imaging results, formulated the assessment and plan and placed orders. The Patient requires high complexity decision making with multiple systems involvement.  Rounds were done with the Respiratory Therapy Director and Staff therapists and discussed with nursing staff also.  [name], MD  [facility] Pulmonary Critical Care Medicine Sleep Medicine

## 2019-06-06 ENCOUNTER — Other Ambulatory Visit (HOSPITAL_COMMUNITY): Payer: Medicare Other

## 2019-06-06 DIAGNOSIS — I63512 Cerebral infarction due to unspecified occlusion or stenosis of left middle cerebral artery: Secondary | ICD-10-CM | POA: Diagnosis not present

## 2019-06-06 DIAGNOSIS — J9621 Acute and chronic respiratory failure with hypoxia: Secondary | ICD-10-CM | POA: Diagnosis not present

## 2019-06-06 DIAGNOSIS — J449 Chronic obstructive pulmonary disease, unspecified: Secondary | ICD-10-CM | POA: Diagnosis not present

## 2019-06-06 DIAGNOSIS — I469 Cardiac arrest, cause unspecified: Secondary | ICD-10-CM | POA: Diagnosis not present

## 2019-06-06 NOTE — Progress Notes (Signed)
Pulmonary Critical Care Medicine [facility] [place]   PULMONARY CRITICAL CARE SERVICE  PROGRESS NOTE  Date of Service: 06/06/2019  Garrett Zimmerman  ZOX:[id number]  DOB: 24-Jun-1945   DOA: 05/19/2019  Referring Physician: Carron Curie, MD  HPI: Garrett Zimmerman is a 74 y.o. male seen for follow up of Acute on Chronic Respiratory Failure.  Patient is comfortable right now without distress has been on T collar  Medications: Reviewed on Rounds  Physical Exam:  Vitals: Temperature 96.4 pulse 101 respiratory 27 blood pressure is 150/76 saturations 95%  Ventilator Settings on T collar FiO2 28%  . General: Comfortable at this time . Eyes: Grossly normal lids, irises & conjunctiva . ENT: grossly tongue is normal . Neck: no obvious mass . Cardiovascular: S1 S2 normal no gallop . Respiratory: No rhonchi coarse breath sounds are noted . Abdomen: soft . Skin: no rash seen on limited exam . Musculoskeletal: not rigid . Psychiatric:unable to assess . Neurologic: no seizure no involuntary movements         Lab Data:   Basic Metabolic Panel: Recent Labs  Lab 05/31/19 0731 06/05/19 0354  NA 142 144  K 4.8 4.2  CL 103 102  CO2 26 32  GLUCOSE 132* 142*  BUN 13 21  CREATININE 0.38* 0.53*  CALCIUM 9.2 9.6  MG 1.9  --     ABG: Recent Labs  Lab 06/05/19 1224  PHART 7.477*  PCO2ART 46.5  PO2ART 79.2*  HCO3 34.0*  O2SAT 96.1    Liver Function Tests: No results for input(s): AST, ALT, ALKPHOS, BILITOT, PROT, ALBUMIN in the last 168 hours. No results for input(s): LIPASE, AMYLASE in the last 168 hours. No results for input(s): AMMONIA in the last 168 hours.  CBC: Recent Labs  Lab 05/31/19 0731 06/05/19 0354  WBC 13.4* 11.2*  HGB 9.4* 9.5*  HCT 31.7* 31.8*  MCV 91.6 89.8  PLT 332 362    Cardiac Enzymes: No results for input(s): CKTOTAL, CKMB, CKMBINDEX, TROPONINI in the last 168 hours.  BNP (last 3 results) No results for input(s): BNP in the last 8760  hours.  ProBNP (last 3 results) No results for input(s): PROBNP in the last 8760 hours.  Radiological Exams: No results found.  Assessment/Plan Active Problems:   Acute on chronic respiratory failure with hypoxia (HCC)   Ventilator associated pneumonia (HCC)   COPD, severe (HCC)   Left acute arterial ischemic stroke, MCA (middle cerebral artery) (HCC)   Cardiac arrest (HCC)   1. Acute on chronic respiratory failure with hypoxia we will continue with weaning process right now is on 28% FiO2 beyond 48 hours at this point.  We will talk to the respiratory therapist about beginning with capping trials 2. Ventilator associated pneumonia treated clinically is improved we will monitor 3. Severe COPD at baseline continue present management 4. Left MCA stroke no change 5. Cardiac arrest rhythm stable   I have personally seen and evaluated the patient, evaluated laboratory and imaging results, formulated the assessment and plan and placed orders. The Patient requires high complexity decision making with multiple systems involvement.  Rounds were done with the Respiratory Therapy Director and Staff therapists and discussed with nursing staff also.  [name], MD [facility] Pulmonary Critical Care Medicine Sleep Medicine

## 2019-06-07 DIAGNOSIS — I63512 Cerebral infarction due to unspecified occlusion or stenosis of left middle cerebral artery: Secondary | ICD-10-CM | POA: Diagnosis not present

## 2019-06-07 DIAGNOSIS — I469 Cardiac arrest, cause unspecified: Secondary | ICD-10-CM | POA: Diagnosis not present

## 2019-06-07 DIAGNOSIS — J9621 Acute and chronic respiratory failure with hypoxia: Secondary | ICD-10-CM | POA: Diagnosis not present

## 2019-06-07 DIAGNOSIS — J449 Chronic obstructive pulmonary disease, unspecified: Secondary | ICD-10-CM | POA: Diagnosis not present

## 2019-06-07 NOTE — Progress Notes (Addendum)
Pulmonary Critical Care Medicine [place]   PULMONARY CRITICAL CARE SERVICE  PROGRESS NOTE  Date of Service: 06/07/2019  Garrett Zimmerman  GMW:[id number]  DOB: Jul 21, 1945   DOA: 05/19/2019  Referring Physician: Merton Border, MD  HPI: Garrett Zimmerman is a 74 y.o. male seen for follow up of Acute on Chronic Respiratory Failure.  Patient currently is on T collar has been on 35% FiO2 this is baseline secretions remain copious  Medications: Reviewed on Rounds  Physical Exam:  Vitals: Temperature 96.2 pulse 108 respiratory rate 19 blood pressure is 146/77 saturations 98%  Ventilator Settings on T collar with an FiO2 35%  . General: Comfortable at this time . Eyes: Grossly normal lids, irises & conjunctiva . ENT: grossly tongue is normal . Neck: no obvious mass . Cardiovascular: S1 S2 normal no gallop . Respiratory: No rhonchi no rales are noted at this time . Abdomen: soft . Skin: no rash seen on limited exam . Musculoskeletal: not rigid . Psychiatric:unable to assess . Neurologic: no seizure no involuntary movements         Lab Data:   Basic Metabolic Panel: Recent Labs  Lab 06/05/19 0354  NA 144  K 4.2  CL 102  CO2 32  GLUCOSE 142*  BUN 21  CREATININE 0.53*  CALCIUM 9.6    ABG: Recent Labs  Lab 06/05/19 1224  PHART 7.477*  PCO2ART 46.5  PO2ART 79.2*  HCO3 34.0*  O2SAT 96.1    Liver Function Tests: No results for input(s): AST, ALT, ALKPHOS, BILITOT, PROT, ALBUMIN in the last 168 hours. No results for input(s): LIPASE, AMYLASE in the last 168 hours. No results for input(s): AMMONIA in the last 168 hours.  CBC: Recent Labs  Lab 06/05/19 0354  WBC 11.2*  HGB 9.5*  HCT 31.8*  MCV 89.8  PLT 362    Cardiac Enzymes: No results for input(s): CKTOTAL, CKMB, CKMBINDEX, TROPONINI in the last 168 hours.  BNP (last 3 results) No results for input(s): BNP in the last 8760 hours.  ProBNP (last 3 results) No results for input(s):  PROBNP in the last 8760 hours.  Radiological Exams: DG CHEST PORT 1 VIEW  Result Date: 06/06/2019 CLINICAL DATA:  Loculated empyema. EXAM: PORTABLE CHEST 1 VIEW COMPARISON:  Chest x-ray 06/03/2019. FINDINGS: Tracheostomy tube in stable position. Right chest tube noted over the right lower chest. Cardiac pacer stable position. Heart size stable. Bilateral pulmonary infiltrates/edema. Small right pleural effusion. Stable appearance of fluid noted in the fissures on the right. No pneumothorax. IMPRESSION: 1. Tracheostomy tube in stable position. Right chest tube noted over the right lower chest. No pneumothorax. 2. Persistent bibasilar atelectasis and infiltrates. Persistent small right pleural effusion with fluid noted in the fissures. Appearance unchanged. 3.  Cardiac pacer noted stable position.  Stable cardiomegaly. Electronically Signed   By: Marcello Moores  Register   On: 06/06/2019 11:30    Assessment/Plan Active Problems:   Acute on chronic respiratory failure with hypoxia (HCC)   Ventilator associated pneumonia (HCC)   COPD, severe (HCC)   Left acute arterial ischemic stroke, MCA (middle cerebral artery) ([place])   Cardiac arrest ([place])   1. Acute on chronic respiratory failure hypoxia we will continue with T collar trials as tolerated continue secretion management pulmonary toilet. 2. Ventilator associated pneumonia treated resolved patient has some persistent basilar infiltrates atelectasis needs aggressive pulmonary toileting 3. Severe COPD medical management continue with supportive care 4. Left sided acute stroke no change continue to monitor 5. Cardiac  arrest right now rhythm is stable we will continue to follow.   I have personally seen and evaluated the patient, evaluated laboratory and imaging results, formulated the assessment and plan and placed orders. The Patient requires high complexity decision making with multiple systems involvement.  Rounds were done with the Respiratory Therapy  Director and Staff therapists and discussed with nursing staff also.  [name], MD [facility] Pulmonary Critical Care Medicine Sleep Medicine

## 2019-06-08 DIAGNOSIS — I469 Cardiac arrest, cause unspecified: Secondary | ICD-10-CM | POA: Diagnosis not present

## 2019-06-08 DIAGNOSIS — J449 Chronic obstructive pulmonary disease, unspecified: Secondary | ICD-10-CM | POA: Diagnosis not present

## 2019-06-08 DIAGNOSIS — J9621 Acute and chronic respiratory failure with hypoxia: Secondary | ICD-10-CM | POA: Diagnosis not present

## 2019-06-08 DIAGNOSIS — I63512 Cerebral infarction due to unspecified occlusion or stenosis of left middle cerebral artery: Secondary | ICD-10-CM | POA: Diagnosis not present

## 2019-06-08 MED ORDER — GENERIC EXTERNAL MEDICATION
Status: DC
Start: ? — End: 2019-06-08

## 2019-06-08 NOTE — Progress Notes (Signed)
Pulmonary Critical Care Medicine [facility] [place]   PULMONARY CRITICAL CARE SERVICE  PROGRESS NOTE  Date of Service: 06/08/2019  ALEKSANDR PELLOW  HDQ:[id number]  DOB: Nov 03, 1945   DOA: 05/19/2019  Referring Physician: Carron Curie, MD  HPI: AIDYNN KRENN is a 74 y.o. male seen for follow up of Acute on Chronic Respiratory Failure.  Patient currently is on T collar has been on 35% FiO2 good saturations are noted secretions are fair to moderate  Medications: Reviewed on Rounds  Physical Exam:  Vitals: Temperature is 97.4 pulse 74 respiratory 18 blood pressure is 120/62 saturations 99%  Ventilator Settings on T collar FiO2 35%  . General: Comfortable at this time . Eyes: Grossly normal lids, irises & conjunctiva . ENT: grossly tongue is normal . Neck: no obvious mass . Cardiovascular: S1 S2 normal no gallop . Respiratory: No rhonchi no rales are noted at this time . Abdomen: soft . Skin: no rash seen on limited exam . Musculoskeletal: not rigid . Psychiatric:unable to assess . Neurologic: no seizure no involuntary movements         Lab Data:   Basic Metabolic Panel: Recent Labs  Lab 06/05/19 0354  NA 144  K 4.2  CL 102  CO2 32  GLUCOSE 142*  BUN 21  CREATININE 0.53*  CALCIUM 9.6    ABG: Recent Labs  Lab 06/05/19 1224  PHART 7.477*  PCO2ART 46.5  PO2ART 79.2*  HCO3 34.0*  O2SAT 96.1    Liver Function Tests: No results for input(s): AST, ALT, ALKPHOS, BILITOT, PROT, ALBUMIN in the last 168 hours. No results for input(s): LIPASE, AMYLASE in the last 168 hours. No results for input(s): AMMONIA in the last 168 hours.  CBC: Recent Labs  Lab 06/05/19 0354  WBC 11.2*  HGB 9.5*  HCT 31.8*  MCV 89.8  PLT 362    Cardiac Enzymes: No results for input(s): CKTOTAL, CKMB, CKMBINDEX, TROPONINI in the last 168 hours.  BNP (last 3 results) No results for input(s): BNP in the last 8760 hours.  ProBNP (last 3 results) No results for input(s):  PROBNP in the last 8760 hours.  Radiological Exams: No results found.  Assessment/Plan Active Problems:   Acute on chronic respiratory failure with hypoxia (HCC)   Ventilator associated pneumonia (HCC)   COPD, severe (HCC)   Left acute arterial ischemic stroke, MCA (middle cerebral artery) (HCC)   Cardiac arrest (HCC)   1. Acute on chronic respiratory failure hypoxia continue with T collar trials as tolerated continue pulmonary toilet supportive care 2. Ventilator associated pneumonia treated we will continue supportive care 3. Severe COPD medical management 4. Left-sided stroke no change therapy as tolerated 5. Cardiac arrest rhythm is stable   I have personally seen and evaluated the patient, evaluated laboratory and imaging results, formulated the assessment and plan and placed orders. The Patient requires high complexity decision making with multiple systems involvement.  Rounds were done with the Respiratory Therapy Director and Staff therapists and discussed with nursing staff also.  [name], MD [facility] Pulmonary Critical Care Medicine Sleep Medicine

## 2019-06-09 DIAGNOSIS — J9621 Acute and chronic respiratory failure with hypoxia: Secondary | ICD-10-CM | POA: Diagnosis not present

## 2019-06-09 DIAGNOSIS — I63512 Cerebral infarction due to unspecified occlusion or stenosis of left middle cerebral artery: Secondary | ICD-10-CM | POA: Diagnosis not present

## 2019-06-09 DIAGNOSIS — J449 Chronic obstructive pulmonary disease, unspecified: Secondary | ICD-10-CM | POA: Diagnosis not present

## 2019-06-09 DIAGNOSIS — I469 Cardiac arrest, cause unspecified: Secondary | ICD-10-CM | POA: Diagnosis not present

## 2019-06-09 LAB — BASIC METABOLIC PANEL
Anion gap: 9 (ref 5–15)
BUN: 24 mg/dL — ABNORMAL HIGH (ref 8–23)
CO2: 31 mmol/L (ref 22–32)
Calcium: 9.9 mg/dL (ref 8.9–10.3)
Chloride: 105 mmol/L (ref 98–111)
Creatinine, Ser: 0.65 mg/dL (ref 0.61–1.24)
GFR calc Af Amer: >60 mL/min (ref >60)
GFR calc non Af Amer: >60 mL/min (ref >60)
Glucose, Bld: 130 mg/dL — ABNORMAL HIGH (ref 70–99)
Potassium: 3.9 mmol/L (ref 3.5–5.1)
Sodium: 145 mmol/L (ref 135–145)

## 2019-06-09 LAB — CBC
HCT: 32.6 % — ABNORMAL LOW (ref 39.0–52.0)
Hemoglobin: 10 g/dL — ABNORMAL LOW (ref 13.0–17.0)
MCH: 27.7 pg (ref 26.0–34.0)
MCHC: 30.7 g/dL (ref 30.0–36.0)
MCV: 90.3 fL (ref 80.0–100.0)
Platelets: 286 10*3/uL (ref 150–400)
RBC: 3.61 MIL/uL — ABNORMAL LOW (ref 4.22–5.81)
RDW: 19.8 % — ABNORMAL HIGH (ref 11.5–15.5)
WBC: 10.5 10*3/uL (ref 4.0–10.5)
nRBC: 0 % (ref 0.0–0.2)

## 2019-06-09 NOTE — Progress Notes (Signed)
    Referring Physician(s): S. Manson Passey  Supervising Physician: Oley Balm  Patient Status:  [facility] - IP  Chief Complaint: Right chest empyema   Subjective: Patient awake, in chair, minimally interactive with IR team.   Allergies: Patient has no allergy information on record.  Medications: Prior to Admission medications   Not on File     Vital Signs: BP 135/75   Pulse (!) 101   SpO2 94%   Physical Exam: Patient awake/alert;sitting up in chair,right chest tube in place with a dressing that is clean, dry and intact;purulent beige-colored fluid in chest tube, no obvious air leak,tube to wall suction;approximately500 ccin the collection container. 75 cc output in last 24 hours. Patient is on trach collar at 35% FiO2. Afebrile.   Imaging: DG CHEST PORT 1 VIEW  Result Date: 06/06/2019 CLINICAL DATA:  Loculated empyema. EXAM: PORTABLE CHEST 1 VIEW COMPARISON:  Chest x-ray 06/03/2019. FINDINGS: Tracheostomy tube in stable position. Right chest tube noted over the right lower chest. Cardiac pacer stable position. Heart size stable. Bilateral pulmonary infiltrates/edema. Small right pleural effusion. Stable appearance of fluid noted in the fissures on the right. No pneumothorax. IMPRESSION: 1. Tracheostomy tube in stable position. Right chest tube noted over the right lower chest. No pneumothorax. 2. Persistent bibasilar atelectasis and infiltrates. Persistent small right pleural effusion with fluid noted in the fissures. Appearance unchanged. 3.  Cardiac pacer noted stable position.  Stable cardiomegaly. Electronically Signed   By: Maisie Fus  Register   On: 06/06/2019 11:30    Labs:  CBC: Recent Labs    05/27/19 0602 05/31/19 0731 06/05/19 0354 06/09/19 0648  WBC 8.7 13.4* 11.2* 10.5  HGB 8.5* 9.4* 9.5* 10.0*  HCT 28.2* 31.7* 31.8* 32.6*  PLT 421* 332 362 286    COAGS: No results for input(s): INR, APTT in the last 8760 hours.  BMP: Recent Labs    05/27/19 0602  05/31/19 0731 06/05/19 0354 06/09/19 0648  NA 144 142 144 145  K 3.8 4.8 4.2 3.9  CL 104 103 102 105  CO2 31 26 32 31  GLUCOSE 115* 132* 142* 130*  BUN 15 13 21  24*  CALCIUM 9.1 9.2 9.6 9.9  CREATININE 0.48* 0.38* 0.53* 0.65  GFRNONAA >60 >60 >60 >60  GFRAA >60 >60 >60 >60    LIVER FUNCTION TESTS: No results for input(s): BILITOT, AST, ALT, ALKPHOS, PROT, ALBUMIN in the last 8760 hours.  Assessment and Plan: Right empyema, status postright chest tube placement in IR 05/23/2019 by Dr. 05/25/2019; LastCXRon 06/06/2019:1. Tracheostomy tube in stable position. Right chest tube noted over the right lower chest. No pneumothorax. 2. Persistent bibasilar atelectasis and infiltrates. Persistent small right pleural effusion with fluid noted in the fissures. Appearance unchanged  Labs from 06/09/2019:WBC 10.5, hemoglobin 10,creatinine 0.65. Pleural fluid cultures from 05/23/2019 with E. coli and Enterococcus;recommend follow-up CT chest when appropriate to reassess adequacy of empyema drainage  Electronically Signed: 05/25/2019, NP 06/09/2019, 10:01 AM   I spent a total of 15 Minutes at the the patient's bedside AND on the patient's hospital floor or unit, greater than 50% of which was counseling/coordinating care for right chest tube.

## 2019-06-09 NOTE — Progress Notes (Signed)
Pulmonary Critical Care Medicine [facility] [place]   PULMONARY CRITICAL CARE SERVICE  PROGRESS NOTE  Date of Service: 06/09/2019  Garrett Zimmerman  [id number]:[id number]  DOB: [birth year]   DOA: 05/19/2019  Referring Physician: Carron Curie, MD  HPI: Garrett Zimmerman is a 74 y.o. male seen for follow up of Acute on Chronic Respiratory Failure.  Patient has baseline right now on T collar has been on 35% FiO2 secretions are fair to moderate  Medications: Reviewed on Rounds  Physical Exam:  Vitals: Temperature 97.4 pulse 91 respiratory rate 17 blood pressure is 135/81 saturations 92%  Ventilator Settings on T collar FiO2 of 35%  . General: Comfortable at this time . Eyes: Grossly normal lids, irises & conjunctiva . ENT: grossly tongue is normal . Neck: no obvious mass . Cardiovascular: S1 S2 normal no gallop . Respiratory: No rhonchi coarse breath sounds are noted at this time . Abdomen: soft . Skin: no rash seen on limited exam . Musculoskeletal: not rigid . Psychiatric:unable to assess . Neurologic: no seizure no involuntary movements         Lab Data:   Basic Metabolic Panel: Recent Labs  Lab 06/05/19 0354 06/09/19 0648  NA 144 145  K 4.2 3.9  CL 102 105  CO2 32 31  GLUCOSE 142* 130*  BUN 21 24*  CREATININE 0.53* 0.65  CALCIUM 9.6 9.9    ABG: Recent Labs  Lab 06/05/19 1224  PHART 7.477*  PCO2ART 46.5  PO2ART 79.2*  HCO3 34.0*  O2SAT 96.1    Liver Function Tests: No results for input(s): AST, ALT, ALKPHOS, BILITOT, PROT, ALBUMIN in the last 168 hours. No results for input(s): LIPASE, AMYLASE in the last 168 hours. No results for input(s): AMMONIA in the last 168 hours.  CBC: Recent Labs  Lab 06/05/19 0354 06/09/19 0648  WBC 11.2* 10.5  HGB 9.5* 10.0*  HCT 31.8* 32.6*  MCV 89.8 90.3  PLT 362 286    Cardiac Enzymes: No results for input(s): CKTOTAL, CKMB, CKMBINDEX, TROPONINI in the last 168 hours.  BNP (last 3 results) No results for  input(s): BNP in the last 8760 hours.  ProBNP (last 3 results) No results for input(s): PROBNP in the last 8760 hours.  Radiological Exams: No results found.  Assessment/Plan Active Problems:   Acute on chronic respiratory failure with hypoxia (HCC)   Ventilator associated pneumonia (HCC)   COPD, severe (HCC)   Left acute arterial ischemic stroke, MCA (middle cerebral artery) (HCC)   Cardiac arrest (HCC)   1. Acute on chronic respiratory failure hypoxia we will continue with T collar trials titrate oxygen continue pulmonary toilet. 2. Ventilator associated pneumonia no change we will continue to follow 3. Severe COPD at baseline continue present management 4. Left-sided stroke no change 5. Cardiac arrest rhythm has been stable   I have personally seen and evaluated the patient, evaluated laboratory and imaging results, formulated the assessment and plan and placed orders. The Patient requires high complexity decision making with multiple systems involvement.  Rounds were done with the Respiratory Therapy Director and Staff therapists and discussed with nursing staff also.  [name], MD [facility] Pulmonary Critical Care Medicine Sleep Medicine

## 2019-06-10 DIAGNOSIS — I63512 Cerebral infarction due to unspecified occlusion or stenosis of left middle cerebral artery: Secondary | ICD-10-CM | POA: Diagnosis not present

## 2019-06-10 DIAGNOSIS — J449 Chronic obstructive pulmonary disease, unspecified: Secondary | ICD-10-CM | POA: Diagnosis not present

## 2019-06-10 DIAGNOSIS — J9621 Acute and chronic respiratory failure with hypoxia: Secondary | ICD-10-CM | POA: Diagnosis not present

## 2019-06-10 DIAGNOSIS — I469 Cardiac arrest, cause unspecified: Secondary | ICD-10-CM | POA: Diagnosis not present

## 2019-06-10 NOTE — Progress Notes (Signed)
Pulmonary Critical Care Medicine [facility] [place]   PULMONARY CRITICAL CARE SERVICE  PROGRESS NOTE  Date of Service: 06/10/2019  Garrett Zimmerman  PNT:[id number]  DOB: 10-20-1945   DOA: 05/19/2019  Referring Physician: Carron Curie, MD  HPI: Garrett Zimmerman is a 74 y.o. male seen for follow up of Acute on Chronic Respiratory Failure.  Patient is on T collar right now on 35% FiO2 with good saturations.  Medications: Reviewed on Rounds  Physical Exam:  Vitals: Temperature is 97.1 pulse 89 respiratory rate 22 blood pressures 143/75 saturations 97%  Ventilator Settings T on T collar with an FiO2 35%  . General: Comfortable at this time . Eyes: Grossly normal lids, irises & conjunctiva . ENT: grossly tongue is normal . Neck: no obvious mass . Cardiovascular: S1 S2 normal no gallop . Respiratory: Scattered rhonchi expansion is equal . Abdomen: soft . Skin: no rash seen on limited exam . Musculoskeletal: not rigid . Psychiatric:unable to assess . Neurologic: no seizure no involuntary movements         Lab Data:   Basic Metabolic Panel: Recent Labs  Lab 06/05/19 0354 06/09/19 0648  NA 144 145  K 4.2 3.9  CL 102 105  CO2 32 31  GLUCOSE 142* 130*  BUN 21 24*  CREATININE 0.53* 0.65  CALCIUM 9.6 9.9    ABG: Recent Labs  Lab 06/05/19 1224  PHART 7.477*  PCO2ART 46.5  PO2ART 79.2*  HCO3 34.0*  O2SAT 96.1    Liver Function Tests: No results for input(s): AST, ALT, ALKPHOS, BILITOT, PROT, ALBUMIN in the last 168 hours. No results for input(s): LIPASE, AMYLASE in the last 168 hours. No results for input(s): AMMONIA in the last 168 hours.  CBC: Recent Labs  Lab 06/05/19 0354 06/09/19 0648  WBC 11.2* 10.5  HGB 9.5* 10.0*  HCT 31.8* 32.6*  MCV 89.8 90.3  PLT 362 286    Cardiac Enzymes: No results for input(s): CKTOTAL, CKMB, CKMBINDEX, TROPONINI in the last 168 hours.  BNP (last 3 results) No results for input(s): BNP in the last 8760  hours.  ProBNP (last 3 results) No results for input(s): PROBNP in the last 8760 hours.  Radiological Exams: No results found.  Assessment/Plan Active Problems:   Acute on chronic respiratory failure with hypoxia (HCC)   Ventilator associated pneumonia (HCC)   COPD, severe (HCC)   Left acute arterial ischemic stroke, MCA (middle cerebral artery) (HCC)   Cardiac arrest (HCC)   1. Acute on chronic respiratory failure hypoxia we will continue with T collar trials titrate as tolerated continue pulmonary toilet. 2. Ventilator associated pneumonia treated clinically improved 3. Severe COPD medical management 4. Left-sided stroke therapy as tolerated 5. Cardiac arrest rhythm has been stable continue to monitor   I have personally seen and evaluated the patient, evaluated laboratory and imaging results, formulated the assessment and plan and placed orders. The Patient requires high complexity decision making with multiple systems involvement.  Rounds were done with the Respiratory Therapy Director and Staff therapists and discussed with nursing staff also.  [name], MD [facility] Pulmonary Critical Care Medicine Sleep Medicine

## 2019-06-10 NOTE — Progress Notes (Signed)
    Referring Physician(s): S. Manson Passey  Supervising Physician: Ruel Favors  Patient Status:  [facility] -IP  Chief Complaint: Right chest empyema  Subjective: Patient awake/alert in bed. He's asking to drink water.   Allergies: Patient has no allergy information on record.  Medications: Prior to Admission medications   Not on File     Vital Signs: BP 135/75   Pulse (!) 101   SpO2 94%   Physical Exam: Patient awake/alert in bed and asking to drink water.Right chest tube in place with a dressing that is clean, dry and intact;purulent beige-colored fluid in chest tube, no obvious air leak,tube to wall suction;approximately625 cc (125 more than yesterday's assessment) in the collection container. Patient is on trach collar at 35% FiO2. Afebrile  Imaging: No results found.  Labs:  CBC: Recent Labs    05/27/19 0602 05/31/19 0731 06/05/19 0354 06/09/19 0648  WBC 8.7 13.4* 11.2* 10.5  HGB 8.5* 9.4* 9.5* 10.0*  HCT 28.2* 31.7* 31.8* 32.6*  PLT 421* 332 362 286    COAGS: No results for input(s): INR, APTT in the last 8760 hours.  BMP: Recent Labs    05/27/19 0602 05/31/19 0731 06/05/19 0354 06/09/19 0648  NA 144 142 144 145  K 3.8 4.8 4.2 3.9  CL 104 103 102 105  CO2 31 26 32 31  GLUCOSE 115* 132* 142* 130*  BUN 15 13 21  24*  CALCIUM 9.1 9.2 9.6 9.9  CREATININE 0.48* 0.38* 0.53* 0.65  GFRNONAA >60 >60 >60 >60  GFRAA >60 >60 >60 >60    LIVER FUNCTION TESTS: No results for input(s): BILITOT, AST, ALT, ALKPHOS, PROT, ALBUMIN in the last 8760 hours.  Assessment and Plan: Right empyema, status postright chest tube placement in IR 05/23/2019 by Dr. 05/25/2019; LastCXRon 06/06/2019:1. Tracheostomy tube in stable position. Right chest tube noted over the right lower chest. No pneumothorax. 2. Persistent bibasilar atelectasis and infiltrates. Persistent small right pleural effusion with fluid noted in the fissures. Appearance unchanged.  Labs from 06/09/2019:WBC  10.5, hemoglobin 10,creatinine 0.65. Pleural fluid cultures from 05/23/2019 with E. coli and Enterococcus;recommend follow-up CT chestwhen appropriateto reassess adequacy of empyema drainage.   Patient had approximately 125 cc output in chest tube collection container compared to yesterday's level.   A chest x-ray was ordered today by the primary team. Will continue to follow.   Electronically Signed: 05/25/2019, NP 06/10/2019, 2:21 PM   I spent a total of 15 Minutes at the the patient's bedside AND on the patient's hospital floor or unit, greater than 50% of which was counseling/coordinating care for right chest tube.

## 2019-06-11 DIAGNOSIS — I63512 Cerebral infarction due to unspecified occlusion or stenosis of left middle cerebral artery: Secondary | ICD-10-CM | POA: Diagnosis not present

## 2019-06-11 DIAGNOSIS — J449 Chronic obstructive pulmonary disease, unspecified: Secondary | ICD-10-CM | POA: Diagnosis not present

## 2019-06-11 DIAGNOSIS — I469 Cardiac arrest, cause unspecified: Secondary | ICD-10-CM | POA: Diagnosis not present

## 2019-06-11 DIAGNOSIS — J9621 Acute and chronic respiratory failure with hypoxia: Secondary | ICD-10-CM | POA: Diagnosis not present

## 2019-06-11 NOTE — Progress Notes (Addendum)
Pulmonary Critical Care Medicine [facility] [place]   PULMONARY CRITICAL CARE SERVICE  PROGRESS NOTE  Date of Service: 06/11/2019  Garrett Zimmerman  JEH:[id number]  DOB: 03/31/1945   DOA: 05/19/2019  Referring Physician: Carron Curie, MD  HPI: Garrett Zimmerman is a 74 y.o. male seen for follow up of Acute on Chronic Respiratory Failure.  Patient remains on 45% aerosol trach collar using PMV with no difficulty satting well with no distress.  Medications: Reviewed on Rounds  Physical Exam:  Vitals: Pulse 92 respirations 22 BP 140/75 O2 sat 97% temp 97.2  Ventilator Settings 35% ATC  . General: Comfortable at this time . Eyes: Grossly normal lids, irises & conjunctiva . ENT: grossly tongue is normal . Neck: no obvious mass . Cardiovascular: S1 S2 normal no gallop . Respiratory: No rales or rhonchi noted . Abdomen: soft . Skin: no rash seen on limited exam . Musculoskeletal: not rigid . Psychiatric:unable to assess . Neurologic: no seizure no involuntary movements         Lab Data:   Basic Metabolic Panel: Recent Labs  Lab 06/05/19 0354 06/09/19 0648  NA 144 145  K 4.2 3.9  CL 102 105  CO2 32 31  GLUCOSE 142* 130*  BUN 21 24*  CREATININE 0.53* 0.65  CALCIUM 9.6 9.9    ABG: Recent Labs  Lab 06/05/19 1224  PHART 7.477*  PCO2ART 46.5  PO2ART 79.2*  HCO3 34.0*  O2SAT 96.1    Liver Function Tests: No results for input(s): AST, ALT, ALKPHOS, BILITOT, PROT, ALBUMIN in the last 168 hours. No results for input(s): LIPASE, AMYLASE in the last 168 hours. No results for input(s): AMMONIA in the last 168 hours.  CBC: Recent Labs  Lab 06/05/19 0354 06/09/19 0648  WBC 11.2* 10.5  HGB 9.5* 10.0*  HCT 31.8* 32.6*  MCV 89.8 90.3  PLT 362 286    Cardiac Enzymes: No results for input(s): CKTOTAL, CKMB, CKMBINDEX, TROPONINI in the last 168 hours.  BNP (last 3 results) No results for input(s): BNP in the last 8760 hours.  ProBNP (last 3 results) No  results for input(s): PROBNP in the last 8760 hours.  Radiological Exams: No results found.  Assessment/Plan Active Problems:   Acute on chronic respiratory failure with hypoxia (HCC)   Ventilator associated pneumonia (HCC)   COPD, severe (HCC)   Left acute arterial ischemic stroke, MCA (middle cerebral artery) (HCC)   Cardiac arrest (HCC)   1. Acute on chronic respiratory failure hypoxia we will continue with T collar trials titrate as tolerated continue pulmonary toilet. 2. Ventilator associated pneumonia treated clinically improved 3. Severe COPD medical management 4. Left-sided stroke therapy as tolerated 5. Cardiac arrest rhythm has been stable continue to monitor   I have personally seen and evaluated the patient, evaluated laboratory and imaging results, formulated the assessment and plan and placed orders. The Patient requires high complexity decision making with multiple systems involvement.  Rounds were done with the Respiratory Therapy Director and Staff therapists and discussed with nursing staff also.  [name], MD [facility] Pulmonary Critical Care Medicine Sleep Medicine

## 2019-06-12 ENCOUNTER — Other Ambulatory Visit (HOSPITAL_COMMUNITY): Payer: Medicare Other

## 2019-06-12 DIAGNOSIS — J9621 Acute and chronic respiratory failure with hypoxia: Secondary | ICD-10-CM | POA: Diagnosis not present

## 2019-06-12 DIAGNOSIS — J449 Chronic obstructive pulmonary disease, unspecified: Secondary | ICD-10-CM | POA: Diagnosis not present

## 2019-06-12 DIAGNOSIS — I469 Cardiac arrest, cause unspecified: Secondary | ICD-10-CM | POA: Diagnosis not present

## 2019-06-12 DIAGNOSIS — I63512 Cerebral infarction due to unspecified occlusion or stenosis of left middle cerebral artery: Secondary | ICD-10-CM | POA: Diagnosis not present

## 2019-06-12 MED ORDER — GENERIC EXTERNAL MEDICATION
Status: DC
Start: ? — End: 2019-06-12

## 2019-06-12 NOTE — Progress Notes (Signed)
Pulmonary Critical Care Medicine [facility] [place]   PULMONARY CRITICAL CARE SERVICE  PROGRESS NOTE  Date of Service: 06/12/2019  Garrett Zimmerman  [id number]  DOB: [birth year]   DOA: 05/19/2019  Referring Physician: Carron Curie, MD  HPI: Garrett Zimmerman is a 74 y.o. male seen for follow up of Acute on Chronic Respiratory Failure.  Patient currently is on T collar has been on 35% FiO2 using the PMV  Medications: Reviewed on Rounds  Physical Exam:  Vitals: Temperature is 96.9 pulse 85 respiratory rate 20 blood pressure is 139/64 saturations 98%  Ventilator Settings on T collar FiO2 35% with PMV  . General: Comfortable at this time . Eyes: Grossly normal lids, irises & conjunctiva . ENT: grossly tongue is normal . Neck: no obvious mass . Cardiovascular: S1 S2 normal no gallop . Respiratory: No rhonchi coarse breath sounds are noted . Abdomen: soft . Skin: no rash seen on limited exam . Musculoskeletal: not rigid . Psychiatric:unable to assess . Neurologic: no seizure no involuntary movements         Lab Data:   Basic Metabolic Panel: Recent Labs  Lab 06/09/19 0648  NA 145  K 3.9  CL 105  CO2 31  GLUCOSE 130*  BUN 24*  CREATININE 0.65  CALCIUM 9.9    ABG: Recent Labs  Lab 06/05/19 1224  PHART 7.477*  PCO2ART 46.5  PO2ART 79.2*  HCO3 34.0*  O2SAT 96.1    Liver Function Tests: No results for input(s): AST, ALT, ALKPHOS, BILITOT, PROT, ALBUMIN in the last 168 hours. No results for input(s): LIPASE, AMYLASE in the last 168 hours. No results for input(s): AMMONIA in the last 168 hours.  CBC: Recent Labs  Lab 06/09/19 0648  WBC 10.5  HGB 10.0*  HCT 32.6*  MCV 90.3  PLT 286    Cardiac Enzymes: No results for input(s): CKTOTAL, CKMB, CKMBINDEX, TROPONINI in the last 168 hours.  BNP (last 3 results) No results for input(s): BNP in the last 8760 hours.  ProBNP (last 3 results) No results for input(s): PROBNP in the last 8760  hours.  Radiological Exams: DG CHEST PORT 1 VIEW  Result Date: 06/12/2019 CLINICAL DATA:  Pleural effusion, chest tube EXAM: PORTABLE CHEST 1 VIEW COMPARISON:  06/06/2019 FINDINGS: Right basilar chest tube remains in place, unchanged. No pneumothorax. Support devices are stable. Heart is normal size. Bibasilar opacities have improved since prior study. No effusions. No acute bony abnormality. IMPRESSION: No visible pneumothorax. Improving bibasilar atelectasis or infiltrates. Electronically Signed   By: Charlett Nose M.D.   On: 06/12/2019 07:34    Assessment/Plan Active Problems:   Acute on chronic respiratory failure with hypoxia (HCC)   Ventilator associated pneumonia (HCC)   COPD, severe (HCC)   Left acute arterial ischemic stroke, MCA (middle cerebral artery) (HCC)   Cardiac arrest (HCC)   1. Acute on chronic respiratory failure with hypoxia we will continue with T collar trials titrate oxygen continue pulmonary toilet right now is on 35% FiO2. 2. Ventilator associated pneumonia treated clinically is improved 3. Severe COPD at baseline we will continue to follow 4. Left-sided acute stroke no change continue present management 5. Cardiac arrest rhythm stable at this time   I have personally seen and evaluated the patient, evaluated laboratory and imaging results, formulated the assessment and plan and placed orders. The Patient requires high complexity decision making with multiple systems involvement.  Rounds were done with the Respiratory Therapy Director and Staff therapists and discussed with  nursing staff also.  Allyne Gee, MD [facility] Pulmonary Critical Care Medicine Sleep Medicine

## 2019-06-13 DIAGNOSIS — J449 Chronic obstructive pulmonary disease, unspecified: Secondary | ICD-10-CM | POA: Diagnosis not present

## 2019-06-13 DIAGNOSIS — I63512 Cerebral infarction due to unspecified occlusion or stenosis of left middle cerebral artery: Secondary | ICD-10-CM | POA: Diagnosis not present

## 2019-06-13 DIAGNOSIS — I469 Cardiac arrest, cause unspecified: Secondary | ICD-10-CM | POA: Diagnosis not present

## 2019-06-13 DIAGNOSIS — J9621 Acute and chronic respiratory failure with hypoxia: Secondary | ICD-10-CM | POA: Diagnosis not present

## 2019-06-13 NOTE — Progress Notes (Addendum)
Pulmonary Critical Care Medicine [facility] [place]   PULMONARY CRITICAL CARE SERVICE  PROGRESS NOTE  Date of Service: 06/13/2019  Garrett Zimmerman  CZY:[id number]  DOB: [birth year]   DOA: 05/19/2019  Referring Physician: Carron Curie, MD  HPI: Garrett Zimmerman is a 74 y.o. male seen for follow up of Acute on Chronic Respiratory Failure.  Patient mains on aerosol trach collar 35% FiO2 using PMV no fever distress at this time.  Medications: Reviewed on Rounds  Physical Exam:  Vitals: Pulse 84 respirations 22 BP 106/65 O2 sat 98% temp 98.4  Ventilator Settings ATC 35%  . General: Comfortable at this time . Eyes: Grossly normal lids, irises & conjunctiva . ENT: grossly tongue is normal . Neck: no obvious mass . Cardiovascular: S1 S2 normal no gallop . Respiratory: No rales or rhonchi noted . Abdomen: soft . Skin: no rash seen on limited exam . Musculoskeletal: not rigid . Psychiatric:unable to assess . Neurologic: no seizure no involuntary movements         Lab Data:   Basic Metabolic Panel: Recent Labs  Lab 06/09/19 0648  NA 145  K 3.9  CL 105  CO2 31  GLUCOSE 130*  BUN 24*  CREATININE 0.65  CALCIUM 9.9    ABG: No results for input(s): PHART, PCO2ART, PO2ART, HCO3, O2SAT in the last 168 hours.  Liver Function Tests: No results for input(s): AST, ALT, ALKPHOS, BILITOT, PROT, ALBUMIN in the last 168 hours. No results for input(s): LIPASE, AMYLASE in the last 168 hours. No results for input(s): AMMONIA in the last 168 hours.  CBC: Recent Labs  Lab 06/09/19 0648  WBC 10.5  HGB 10.0*  HCT 32.6*  MCV 90.3  PLT 286    Cardiac Enzymes: No results for input(s): CKTOTAL, CKMB, CKMBINDEX, TROPONINI in the last 168 hours.  BNP (last 3 results) No results for input(s): BNP in the last 8760 hours.  ProBNP (last 3 results) No results for input(s): PROBNP in the last 8760 hours.  Radiological Exams: DG CHEST PORT 1 VIEW  Result Date:  06/12/2019 CLINICAL DATA:  Pleural effusion, chest tube EXAM: PORTABLE CHEST 1 VIEW COMPARISON:  06/06/2019 FINDINGS: Right basilar chest tube remains in place, unchanged. No pneumothorax. Support devices are stable. Heart is normal size. Bibasilar opacities have improved since prior study. No effusions. No acute bony abnormality. IMPRESSION: No visible pneumothorax. Improving bibasilar atelectasis or infiltrates. Electronically Signed   By: Charlett Nose M.D.   On: 06/12/2019 07:34    Assessment/Plan Active Problems:   Acute on chronic respiratory failure with hypoxia (HCC)   Ventilator associated pneumonia (HCC)   COPD, severe (HCC)   Left acute arterial ischemic stroke, MCA (middle cerebral artery) (HCC)   Cardiac arrest (HCC)   1. Acute on chronic respiratory failure with hypoxia patient continues to do well on aerosol trach collar at this time using PMV with no difficulty.  Current FiO2 requirement is 35%.  Continue supportive measures and pulmonary toilet. 2. Ventilator associated pneumonia treated clinically is improved 3. Severe COPD at baseline we will continue to follow 4. Left-sided acute stroke no change continue present management 5. Cardiac arrest rhythm stable at this time   I have personally seen and evaluated the patient, evaluated laboratory and imaging results, formulated the assessment and plan and placed orders. The Patient requires high complexity decision making with multiple systems involvement.  Rounds were done with the Respiratory Therapy Director and Staff therapists and discussed with nursing staff also.  Betta Balla  Richardson Dopp, MD [facility] Pulmonary Critical Care Medicine Sleep Medicine

## 2019-06-13 NOTE — Progress Notes (Signed)
Referring Physician(s): Dr. Saul Fordyce  Supervising Physician: Jacqulynn Cadet  Patient Status:  [facility] - In-pt  Chief Complaint:  Right sided empeyma  Subjective:  74 y.o. male inpatient. History of acute on chronic respiratory failure currently trached found to have an empeyma. IR placed a right sided chest tube on 4.16.21. Patient alert and laying in bed, calm and comfortable. Denies any pain, SOB.  Allergies: Patient has no allergy information on record.  Medications: Prior to Admission medications   Not on File     Vital Signs: BP 135/75   Pulse (!) 101   SpO2 94%   Physical Exam Vitals and nursing note reviewed.  Constitutional:      Appearance: He is well-developed.  HENT:     Head: Normocephalic.  Pulmonary:     Effort: Pulmonary effort is normal.     Comments: Right sided pigtail to duval. site is unremarkable with no erythema, edema, tenderness, bleeding or drainage noted at exit site. Dressing is clean dry and intact. 730 ml of  purulent colored fluid noted in duval. No air leak noted   Musculoskeletal:        General: Normal range of motion.     Cervical back: Normal range of motion.  Skin:    General: Skin is dry.  Neurological:     Mental Status: He is alert and oriented to person, place, and time.     Imaging: DG CHEST PORT 1 VIEW  Result Date: 06/12/2019 CLINICAL DATA:  Pleural effusion, chest tube EXAM: PORTABLE CHEST 1 VIEW COMPARISON:  06/06/2019 FINDINGS: Right basilar chest tube remains in place, unchanged. No pneumothorax. Support devices are stable. Heart is normal size. Bibasilar opacities have improved since prior study. No effusions. No acute bony abnormality. IMPRESSION: No visible pneumothorax. Improving bibasilar atelectasis or infiltrates. Electronically Signed   By: Rolm Baptise M.D.   On: 06/12/2019 07:34    Labs:  CBC: Recent Labs    05/27/19 0602 05/31/19 0731 06/05/19 0354 06/09/19 0648  WBC 8.7 13.4* 11.2* 10.5   HGB 8.5* 9.4* 9.5* 10.0*  HCT 28.2* 31.7* 31.8* 32.6*  PLT 421* 332 362 286    COAGS: No results for input(s): INR, APTT in the last 8760 hours.  BMP: Recent Labs    05/27/19 0602 05/31/19 0731 06/05/19 0354 06/09/19 0648  NA 144 142 144 145  K 3.8 4.8 4.2 3.9  CL 104 103 102 105  CO2 31 26 32 31  GLUCOSE 115* 132* 142* 130*  BUN 15 13 21  24*  CALCIUM 9.1 9.2 9.6 9.9  CREATININE 0.48* 0.38* 0.53* 0.65  GFRNONAA >60 >60 >60 >60  GFRAA >60 >60 >60 >60    LIVER FUNCTION TESTS: No results for input(s): BILITOT, AST, ALT, ALKPHOS, PROT, ALBUMIN in the last 8760 hours.  Assessment and Plan:  74 y.o, male inpatient ([facility]). History of acute on chronic respiratory failure currently trached found to have an empeyma. IR placed a right sided chest tube on 4.16.21. Jearld Lesch shows 730 ml of purulent output with no air leak noted  Pertinent Imaging 5.6.21 - Chest xray shows chest tube in place with no pneumothorax  Pertinent IR History 4.16.21 - Placement 12 Fr chest tube  Pertinent Allergies unknown  BUN 24 (from 5.3.21) All other labs and medications are within acceptable parameters.     IR will continue to follow along - plans per Primary Team.   Electronically Signed: Avel Peace, NP 06/13/2019, 11:28 AM   I spent  a total of 20 minutes at the the patient's bedside AND on the patient's hospital floor or unit, greater than 50% of which was counseling/coordinating care for right sided chest tube

## 2019-06-14 DIAGNOSIS — I63512 Cerebral infarction due to unspecified occlusion or stenosis of left middle cerebral artery: Secondary | ICD-10-CM | POA: Diagnosis not present

## 2019-06-14 DIAGNOSIS — J449 Chronic obstructive pulmonary disease, unspecified: Secondary | ICD-10-CM | POA: Diagnosis not present

## 2019-06-14 DIAGNOSIS — I469 Cardiac arrest, cause unspecified: Secondary | ICD-10-CM | POA: Diagnosis not present

## 2019-06-14 DIAGNOSIS — J9621 Acute and chronic respiratory failure with hypoxia: Secondary | ICD-10-CM | POA: Diagnosis not present

## 2019-06-14 NOTE — Progress Notes (Signed)
Pulmonary Critical Care Medicine [facility] [place]   PULMONARY CRITICAL CARE SERVICE  PROGRESS NOTE  Date of Service: 06/14/2019  Garrett Zimmerman  [id number]:[id number]  DOB: 03-15-1945   DOA: 05/19/2019  Referring Physician: Carron Curie, MD  HPI: Garrett Zimmerman is a 74 y.o. male seen for follow up of Acute on Chronic Respiratory Failure.  Patient currently is on T collar has been on 28% FiO2 using the PMV doing well secretions are improved reportedly  Medications: Reviewed on Rounds  Physical Exam:  Vitals: Temperature 98.3 pulse 88 respiratory rate 23 blood pressure is 148/83 saturations 94%  Ventilator Settings on T collar with an FiO2 of 28% with PMV  . General: Comfortable at this time . Eyes: Grossly normal lids, irises & conjunctiva . ENT: grossly tongue is normal . Neck: no obvious mass . Cardiovascular: S1 S2 normal no gallop . Respiratory: No rhonchi no rales are noted at this time. . Abdomen: soft . Skin: no rash seen on limited exam . Musculoskeletal: not rigid . Psychiatric:unable to assess . Neurologic: no seizure no involuntary movements         Lab Data:   Basic Metabolic Panel: Recent Labs  Lab 06/09/19 0648  NA 145  K 3.9  CL 105  CO2 31  GLUCOSE 130*  BUN 24*  CREATININE 0.65  CALCIUM 9.9    ABG: No results for input(s): PHART, PCO2ART, PO2ART, HCO3, O2SAT in the last 168 hours.  Liver Function Tests: No results for input(s): AST, ALT, ALKPHOS, BILITOT, PROT, ALBUMIN in the last 168 hours. No results for input(s): LIPASE, AMYLASE in the last 168 hours. No results for input(s): AMMONIA in the last 168 hours.  CBC: Recent Labs  Lab 06/09/19 0648  WBC 10.5  HGB 10.0*  HCT 32.6*  MCV 90.3  PLT 286    Cardiac Enzymes: No results for input(s): CKTOTAL, CKMB, CKMBINDEX, TROPONINI in the last 168 hours.  BNP (last 3 results) No results for input(s): BNP in the last 8760 hours.  ProBNP (last 3 results) No results for  input(s): PROBNP in the last 8760 hours.  Radiological Exams: No results found.  Assessment/Plan Active Problems:   Acute on chronic respiratory failure with hypoxia (HCC)   Ventilator associated pneumonia (HCC)   COPD, severe (HCC)   Left acute arterial ischemic stroke, MCA (middle cerebral artery) (HCC)   Cardiac arrest (HCC)   1. Acute on chronic respiratory failure hypoxia continue with T collar trials titrate oxygen continue pulmonary toilet. 2. Ventilator associated pneumonia treated clinically improved 3. Severe COPD at baseline we will continue to follow 4. Left-sided stroke no change 5. Cardiac arrest rhythm is stable at this time we will continue present management   I have personally seen and evaluated the patient, evaluated laboratory and imaging results, formulated the assessment and plan and placed orders. The Patient requires high complexity decision making with multiple systems involvement.  Rounds were done with the Respiratory Therapy Director and Staff therapists and discussed with nursing staff also.  [name], MD [facility] Pulmonary Critical Care Medicine Sleep Medicine

## 2019-06-15 DIAGNOSIS — I469 Cardiac arrest, cause unspecified: Secondary | ICD-10-CM | POA: Diagnosis not present

## 2019-06-15 DIAGNOSIS — J9621 Acute and chronic respiratory failure with hypoxia: Secondary | ICD-10-CM | POA: Diagnosis not present

## 2019-06-15 DIAGNOSIS — J449 Chronic obstructive pulmonary disease, unspecified: Secondary | ICD-10-CM | POA: Diagnosis not present

## 2019-06-15 DIAGNOSIS — I63512 Cerebral infarction due to unspecified occlusion or stenosis of left middle cerebral artery: Secondary | ICD-10-CM | POA: Diagnosis not present

## 2019-06-15 NOTE — Progress Notes (Signed)
Pulmonary Critical Care Medicine [facility] [place]   PULMONARY CRITICAL CARE SERVICE  PROGRESS NOTE  Date of Service: 06/15/2019  WEST BOOMERSHINE  QZE:[id number]  DOB: 1945-10-07   DOA: 05/19/2019  Referring Physician: Carron Curie, MD  HPI: Garrett Zimmerman is a 74 y.o. male seen for follow up of Acute on Chronic Respiratory Failure.  Patient currently is on T collar has been on 35% FiO2 good saturations.  Secretions are moderate at this time  Medications: Reviewed on Rounds  Physical Exam:  Vitals: Temperature 97.3 pulse 102 respiratory 26 blood pressure is 129/79 saturations 92%  Ventilator Settings on T collar with an FiO2 of 35%  . General: Comfortable at this time . Eyes: Grossly normal lids, irises & conjunctiva . ENT: grossly tongue is normal . Neck: no obvious mass . Cardiovascular: S1 S2 normal no gallop . Respiratory: No rhonchi coarse breath sounds are noted . Abdomen: soft . Skin: no rash seen on limited exam . Musculoskeletal: not rigid . Psychiatric:unable to assess . Neurologic: no seizure no involuntary movements         Lab Data:   Basic Metabolic Panel: Recent Labs  Lab 06/09/19 0648  NA 145  K 3.9  CL 105  CO2 31  GLUCOSE 130*  BUN 24*  CREATININE 0.65  CALCIUM 9.9    ABG: No results for input(s): PHART, PCO2ART, PO2ART, HCO3, O2SAT in the last 168 hours.  Liver Function Tests: No results for input(s): AST, ALT, ALKPHOS, BILITOT, PROT, ALBUMIN in the last 168 hours. No results for input(s): LIPASE, AMYLASE in the last 168 hours. No results for input(s): AMMONIA in the last 168 hours.  CBC: Recent Labs  Lab 06/09/19 0648  WBC 10.5  HGB 10.0*  HCT 32.6*  MCV 90.3  PLT 286    Cardiac Enzymes: No results for input(s): CKTOTAL, CKMB, CKMBINDEX, TROPONINI in the last 168 hours.  BNP (last 3 results) No results for input(s): BNP in the last 8760 hours.  ProBNP (last 3 results) No results for input(s): PROBNP in the  last 8760 hours.  Radiological Exams: No results found.  Assessment/Plan Active Problems:   Acute on chronic respiratory failure with hypoxia (HCC)   Ventilator associated pneumonia (HCC)   COPD, severe (HCC)   Left acute arterial ischemic stroke, MCA (middle cerebral artery) (HCC)   Cardiac arrest (HCC)   1. Acute on chronic respiratory failure hypoxia we will continue with T collar trials titrate oxygen down as tolerated continue secretion management 2. Ventilator associated pneumonia treated clinically improved 3. Severe COPD at baseline we will continue to follow 4. Acute stroke MCA therapy as tolerated 5. Cardiac arrest rhythm is stable we will continue to follow    I have personally seen and evaluated the patient, evaluated laboratory and imaging results, formulated the assessment and plan and placed orders. The Patient requires high complexity decision making with multiple systems involvement.  Rounds were done with the Respiratory Therapy Director and Staff therapists and discussed with nursing staff also.  [name], MD [facility] Pulmonary Critical Care Medicine Sleep Medicine

## 2019-06-16 ENCOUNTER — Other Ambulatory Visit (HOSPITAL_COMMUNITY): Payer: Medicare Other

## 2019-06-16 DIAGNOSIS — I469 Cardiac arrest, cause unspecified: Secondary | ICD-10-CM | POA: Diagnosis not present

## 2019-06-16 DIAGNOSIS — J9621 Acute and chronic respiratory failure with hypoxia: Secondary | ICD-10-CM | POA: Diagnosis not present

## 2019-06-16 DIAGNOSIS — J449 Chronic obstructive pulmonary disease, unspecified: Secondary | ICD-10-CM | POA: Diagnosis not present

## 2019-06-16 DIAGNOSIS — I63512 Cerebral infarction due to unspecified occlusion or stenosis of left middle cerebral artery: Secondary | ICD-10-CM | POA: Diagnosis not present

## 2019-06-16 LAB — BASIC METABOLIC PANEL
Anion gap: 10 (ref 5–15)
BUN: 19 mg/dL (ref 8–23)
CO2: 30 mmol/L (ref 22–32)
Calcium: 9.7 mg/dL (ref 8.9–10.3)
Chloride: 101 mmol/L (ref 98–111)
Creatinine, Ser: 0.72 mg/dL (ref 0.61–1.24)
GFR calc Af Amer: >60 mL/min (ref >60)
GFR calc non Af Amer: >60 mL/min (ref >60)
Glucose, Bld: 125 mg/dL — ABNORMAL HIGH (ref 70–99)
Potassium: 4 mmol/L (ref 3.5–5.1)
Sodium: 141 mmol/L (ref 135–145)

## 2019-06-16 LAB — CBC
HCT: 32.1 % — ABNORMAL LOW (ref 39.0–52.0)
Hemoglobin: 9.6 g/dL — ABNORMAL LOW (ref 13.0–17.0)
MCH: 27.4 pg (ref 26.0–34.0)
MCHC: 29.9 g/dL — ABNORMAL LOW (ref 30.0–36.0)
MCV: 91.5 fL (ref 80.0–100.0)
Platelets: 248 10*3/uL (ref 150–400)
RBC: 3.51 MIL/uL — ABNORMAL LOW (ref 4.22–5.81)
RDW: 19.9 % — ABNORMAL HIGH (ref 11.5–15.5)
WBC: 7.8 10*3/uL (ref 4.0–10.5)
nRBC: 0 % (ref 0.0–0.2)

## 2019-06-16 NOTE — Progress Notes (Signed)
Supervising Physician: Daryll Brod  Patient Status:  [facility] - In-pt  Chief Complaint:  Right sided Empyema  Subjective:  74 y.o. male. History of CVA, acute on chronic respiratory failure currently trached found to have an empyema. The output in the duval is 860 ml of purulent output.. Patient alert and laying in bed, calm and comfortable. Denies any chest pain, SOB, cough. Pulmonology with no chest tube recommendation noted.     Allergies: Patient has no allergy information on record.  Medications: Prior to Admission medications   Not on File     Vital Signs: BP 135/75   Pulse (!) 101   SpO2 94%   Physical Exam Vitals and nursing note reviewed.  Constitutional:      Appearance: He is well-developed.  HENT:     Head: Normocephalic.  Pulmonary:     Effort: Pulmonary effort is normal.     Comments: Positive right side chest to duval suction device. Site is unremarkable with no erythema, edema, tenderness, bleeding or drainage noted at exit site. Suture in place. Dressing is clean dry and intact. 860 ml of purulent output noted in duval.  Musculoskeletal:        General: Normal range of motion.     Cervical back: Normal range of motion.  Skin:    General: Skin is dry.  Neurological:     Mental Status: He is alert and oriented to person, place, and time.     Imaging: DG CHEST PORT 1 VIEW  Result Date: 06/16/2019 CLINICAL DATA:  74 year old male treated for right lung empyema with CT-guided chest tube drainage. EXAM: PORTABLE CHEST 1 VIEW COMPARISON:  Portable chest 05/13/2019 and earlier. FINDINGS: Portable AP semi upright view at 0555 hours. Stable right lower chest pigtail pleural catheter. Stable tracheostomy tube. Stable left chest pacemaker. No pneumothorax. Bilateral ventilation is stable. Mild patchy bibasilar opacity. Stable visualized osseous structures. IMPRESSION: 1. Stable right chest tube with no pneumothorax. 2. Stable tracheostomy tube and  ventilation with mild atelectasis suspected. Electronically Signed   By: Genevie Ann M.D.   On: 06/16/2019 07:43   DG Humerus Left  Result Date: 06/16/2019 CLINICAL DATA:  74 year old male with left arm stiffness and weakness. EXAM: LEFT HUMERUS - 2+ VIEW COMPARISON:  Portable chest radiographs 06/12/2019 and earlier. FINDINGS: Portable AP and lateral views of the left humerus. Left chest cardiac pacemaker again. IV access at the left antecubital fossa. Bone mineralization is within normal limits for age. The left humerus appears intact with grossly normal alignment at the left shoulder and elbow. Visible left shoulder osseous structures appear grossly intact. There is a small ossific fragment at the tip of a degenerative proximal ulna osteophyte. But no definite regional soft tissue swelling. IMPRESSION: 1. Negative left humerus. 2. Cannot exclude small recent fracture at the tip of a degenerative osteophyte off the proximal ulna. Electronically Signed   By: Genevie Ann M.D.   On: 06/16/2019 07:37    Labs:  CBC: Recent Labs    05/31/19 0731 06/05/19 0354 06/09/19 0648 06/16/19 0613  WBC 13.4* 11.2* 10.5 7.8  HGB 9.4* 9.5* 10.0* 9.6*  HCT 31.7* 31.8* 32.6* 32.1*  PLT 332 362 286 248    COAGS: No results for input(s): INR, APTT in the last 8760 hours.  BMP: Recent Labs    05/31/19 0731 06/05/19 0354 06/09/19 0648 06/16/19 0613  NA 142 144 145 141  K 4.8 4.2 3.9 4.0  CL 103 102 105 101  CO2 26  32 31 30  GLUCOSE 132* 142* 130* 125*  BUN 13 21 24* 19  CALCIUM 9.2 9.6 9.9 9.7  CREATININE 0.38* 0.53* 0.65 0.72  GFRNONAA >60 >60 >60 >60  GFRAA >60 >60 >60 >60    LIVER FUNCTION TESTS: No results for input(s): BILITOT, AST, ALT, ALKPHOS, PROT, ALBUMIN in the last 8760 hours.  Assessment and Plan:  74 y.o, male inpatient. History of, CVA, acute on chronic respiratory failure currently trached found to have an  right sided empyema. The output in the duval is 860 ml  (130 ml since  Friday) of purulent output. No overt air leak noted. Patient is being followed by [facility] with no chest tube recommendation noted.   Pertinent Imaging 5.10.21 - Chest xray   Pertinent IR History 4.16.21 - Chest tube placement right side  Pertinent Allergies Not on file  No recent  labs and medications are within acceptable parameters.     Per IR Attending (Dr. Miles Costain) recommend continuation of daily chest xray's for evaluation of existing empyema.    Electronically Signed: Marletta Lor, NP 06/16/2019, 11:38 AM   I spent a total of 15 Minutes at the the patient's bedside AND on the patient's hospital floor or unit, greater than 50% of which was counseling/coordinating care for chest tube

## 2019-06-16 NOTE — Progress Notes (Signed)
Pulmonary Critical Care Medicine [place]   PULMONARY CRITICAL CARE SERVICE  PROGRESS NOTE  Date of Service: 06/16/2019  HAITHAM DOLINSKY  ZOX:[id number]  DOB: [birth year]   DOA: 05/19/2019  Referring Physician: Merton Border, MD  HPI: JEMAL MISKELL is a 74 y.o. male seen for follow up of Acute on Chronic Respiratory Failure.  Patient currently is on T collar with saturations of 96% as well as small amount of secretions noted by respiratory therapy  Medications: Reviewed on Rounds  Physical Exam:  Vitals: Temperature is 97.1 pulse 96 respiratory 18 blood pressure is 114/71 saturations 96%  Ventilator Settings on T collar currently 35% FiO2  . General: Comfortable at this time . Eyes: Grossly normal lids, irises & conjunctiva . ENT: grossly tongue is normal . Neck: no obvious mass . Cardiovascular: S1 S2 normal no gallop . Respiratory: No rhonchi coarse breath sounds are noted . Abdomen: soft . Skin: no rash seen on limited exam . Musculoskeletal: not rigid . Psychiatric:unable to assess . Neurologic: no seizure no involuntary movements         Lab Data:   Basic Metabolic Panel: Recent Labs  Lab 06/16/19 0613  NA 141  K 4.0  CL 101  CO2 30  GLUCOSE 125*  BUN 19  CREATININE 0.72  CALCIUM 9.7    ABG: No results for input(s): PHART, PCO2ART, PO2ART, HCO3, O2SAT in the last 168 hours.  Liver Function Tests: No results for input(s): AST, ALT, ALKPHOS, BILITOT, PROT, ALBUMIN in the last 168 hours. No results for input(s): LIPASE, AMYLASE in the last 168 hours. No results for input(s): AMMONIA in the last 168 hours.  CBC: Recent Labs  Lab 06/16/19 0613  WBC 7.8  HGB 9.6*  HCT 32.1*  MCV 91.5  PLT 248    Cardiac Enzymes: No results for input(s): CKTOTAL, CKMB, CKMBINDEX, TROPONINI in the last 168 hours.  BNP (last 3 results) No results for input(s): BNP in the last 8760 hours.  ProBNP (last 3 results) No results for input(s):  PROBNP in the last 8760 hours.  Radiological Exams: DG CHEST PORT 1 VIEW  Result Date: 06/16/2019 CLINICAL DATA:  74 year old male treated for right lung empyema with CT-guided chest tube drainage. EXAM: PORTABLE CHEST 1 VIEW COMPARISON:  Portable chest 05/13/2019 and earlier. FINDINGS: Portable AP semi upright view at 0555 hours. Stable right lower chest pigtail pleural catheter. Stable tracheostomy tube. Stable left chest pacemaker. No pneumothorax. Bilateral ventilation is stable. Mild patchy bibasilar opacity. Stable visualized osseous structures. IMPRESSION: 1. Stable right chest tube with no pneumothorax. 2. Stable tracheostomy tube and ventilation with mild atelectasis suspected. Electronically Signed   By: Genevie Ann M.D.   On: 06/16/2019 07:43   DG Humerus Left  Result Date: 06/16/2019 CLINICAL DATA:  74 year old male with left arm stiffness and weakness. EXAM: LEFT HUMERUS - 2+ VIEW COMPARISON:  Portable chest radiographs 06/12/2019 and earlier. FINDINGS: Portable AP and lateral views of the left humerus. Left chest cardiac pacemaker again. IV access at the left antecubital fossa. Bone mineralization is within normal limits for age. The left humerus appears intact with grossly normal alignment at the left shoulder and elbow. Visible left shoulder osseous structures appear grossly intact. There is a small ossific fragment at the tip of a degenerative proximal ulna osteophyte. But no definite regional soft tissue swelling. IMPRESSION: 1. Negative left humerus. 2. Cannot exclude small recent fracture at the tip of a degenerative osteophyte off the proximal ulna. Electronically Signed  By: Odessa Fleming M.D.   On: 06/16/2019 07:37    Assessment/Plan Active Problems:   Acute on chronic respiratory failure with hypoxia (HCC)   Ventilator associated pneumonia (HCC)   COPD, severe (HCC)   Left acute arterial ischemic stroke, MCA (middle cerebral artery) (HCC)   Cardiac arrest (HCC)   1. Acute on  chronic respiratory failure with hypoxia we will continue with T collar trials titrate oxygen continue pulmonary toilet. 2. Ventilator associated pneumonia treated clinically improved 3. Severe COPD at baseline we will continue to follow 4. Left acute stroke no change continue with supportive care 5. Cardiac arrest rhythm is stable at this time   I have personally seen and evaluated the patient, evaluated laboratory and imaging results, formulated the assessment and plan and placed orders. The Patient requires high complexity decision making with multiple systems involvement.  Rounds were done with the Respiratory Therapy Director and Staff therapists and discussed with nursing staff also.  [name], MD [facility] Pulmonary Critical Care Medicine Sleep Medicine

## 2019-06-17 DIAGNOSIS — J449 Chronic obstructive pulmonary disease, unspecified: Secondary | ICD-10-CM | POA: Diagnosis not present

## 2019-06-17 DIAGNOSIS — J9621 Acute and chronic respiratory failure with hypoxia: Secondary | ICD-10-CM | POA: Diagnosis not present

## 2019-06-17 DIAGNOSIS — I469 Cardiac arrest, cause unspecified: Secondary | ICD-10-CM | POA: Diagnosis not present

## 2019-06-17 DIAGNOSIS — I63512 Cerebral infarction due to unspecified occlusion or stenosis of left middle cerebral artery: Secondary | ICD-10-CM | POA: Diagnosis not present

## 2019-06-17 LAB — URINALYSIS, ROUTINE W REFLEX MICROSCOPIC
Bilirubin Urine: NEGATIVE
Glucose, UA: NEGATIVE mg/dL
Hgb urine dipstick: NEGATIVE
Ketones, ur: NEGATIVE mg/dL
Leukocytes,Ua: NEGATIVE
Nitrite: NEGATIVE
Protein, ur: NEGATIVE mg/dL
Specific Gravity, Urine: 1.018 (ref 1.005–1.030)
pH: 8 (ref 5.0–8.0)

## 2019-06-17 NOTE — Progress Notes (Signed)
Pulmonary Critical Care Medicine [place]   PULMONARY CRITICAL CARE SERVICE  PROGRESS NOTE  Date of Service: 06/17/2019  Garrett Zimmerman  IHK:[id number]  DOB: 03/10/1945   DOA: 05/19/2019  Referring Physician: Merton Border, MD  HPI: Garrett Zimmerman is a 74 y.o. male seen for follow up of Acute on Chronic Respiratory Failure.  Patient currently is on T collar is doing fairly well has been on 35% FiO2 using PMV  Medications: Reviewed on Rounds  Physical Exam:  Vitals: Temperature is 96.6 pulse 100 respiratory rate 28 blood pressure is 147/73 saturations 95%  Ventilator Settings on T collar FiO2 35% with PMV  . General: Comfortable at this time . Eyes: Grossly normal lids, irises & conjunctiva . ENT: grossly tongue is normal . Neck: no obvious mass . Cardiovascular: S1 S2 normal no gallop . Respiratory: No rhonchi no rales are noted at this time . Abdomen: soft . Skin: no rash seen on limited exam . Musculoskeletal: not rigid . Psychiatric:unable to assess . Neurologic: no seizure no involuntary movements         Lab Data:   Basic Metabolic Panel: Recent Labs  Lab 06/16/19 0613  NA 141  K 4.0  CL 101  CO2 30  GLUCOSE 125*  BUN 19  CREATININE 0.72  CALCIUM 9.7    ABG: No results for input(s): PHART, PCO2ART, PO2ART, HCO3, O2SAT in the last 168 hours.  Liver Function Tests: No results for input(s): AST, ALT, ALKPHOS, BILITOT, PROT, ALBUMIN in the last 168 hours. No results for input(s): LIPASE, AMYLASE in the last 168 hours. No results for input(s): AMMONIA in the last 168 hours.  CBC: Recent Labs  Lab 06/16/19 0613  WBC 7.8  HGB 9.6*  HCT 32.1*  MCV 91.5  PLT 248    Cardiac Enzymes: No results for input(s): CKTOTAL, CKMB, CKMBINDEX, TROPONINI in the last 168 hours.  BNP (last 3 results) No results for input(s): BNP in the last 8760 hours.  ProBNP (last 3 results) No results for input(s): PROBNP in the last 8760  hours.  Radiological Exams: DG CHEST PORT 1 VIEW  Result Date: 06/16/2019 CLINICAL DATA:  74 year old male treated for right lung empyema with CT-guided chest tube drainage. EXAM: PORTABLE CHEST 1 VIEW COMPARISON:  Portable chest 05/13/2019 and earlier. FINDINGS: Portable AP semi upright view at 0555 hours. Stable right lower chest pigtail pleural catheter. Stable tracheostomy tube. Stable left chest pacemaker. No pneumothorax. Bilateral ventilation is stable. Mild patchy bibasilar opacity. Stable visualized osseous structures. IMPRESSION: 1. Stable right chest tube with no pneumothorax. 2. Stable tracheostomy tube and ventilation with mild atelectasis suspected. Electronically Signed   By: Genevie Ann M.D.   On: 06/16/2019 07:43   DG Humerus Left  Result Date: 06/16/2019 CLINICAL DATA:  74 year old male with left arm stiffness and weakness. EXAM: LEFT HUMERUS - 2+ VIEW COMPARISON:  Portable chest radiographs 06/12/2019 and earlier. FINDINGS: Portable AP and lateral views of the left humerus. Left chest cardiac pacemaker again. IV access at the left antecubital fossa. Bone mineralization is within normal limits for age. The left humerus appears intact with grossly normal alignment at the left shoulder and elbow. Visible left shoulder osseous structures appear grossly intact. There is a small ossific fragment at the tip of a degenerative proximal ulna osteophyte. But no definite regional soft tissue swelling. IMPRESSION: 1. Negative left humerus. 2. Cannot exclude small recent fracture at the tip of a degenerative osteophyte off the proximal ulna. Electronically Signed  By: Odessa Fleming M.D.   On: 06/16/2019 07:37    Assessment/Plan Active Problems:   Acute on chronic respiratory failure with hypoxia (HCC)   Ventilator associated pneumonia (HCC)   COPD, severe (HCC)   Left acute arterial ischemic stroke, MCA (middle cerebral artery) (HCC)   Cardiac arrest (HCC)   1. Acute on chronic respiratory failure  with hypoxia we will continue with the weaning process right now doing well on 35% FiO2 continue secretion management supportive care. 2. Ventilator associated pneumonia treated clinically improving 3. Severe COPD at baseline continue present management medical management 4. Acute stroke no change physical therapy as tolerated 5. Cardiac arrest rhythm has been stable we will continue to monitor closely   I have personally seen and evaluated the patient, evaluated laboratory and imaging results, formulated the assessment and plan and placed orders. The Patient requires high complexity decision making with multiple systems involvement.  Rounds were done with the Respiratory Therapy Director and Staff therapists and discussed with nursing staff also.  [name], MD [facility] Pulmonary Critical Care Medicine Sleep Medicine

## 2019-06-18 DIAGNOSIS — J9621 Acute and chronic respiratory failure with hypoxia: Secondary | ICD-10-CM | POA: Diagnosis not present

## 2019-06-18 DIAGNOSIS — J449 Chronic obstructive pulmonary disease, unspecified: Secondary | ICD-10-CM | POA: Diagnosis not present

## 2019-06-18 DIAGNOSIS — I63512 Cerebral infarction due to unspecified occlusion or stenosis of left middle cerebral artery: Secondary | ICD-10-CM | POA: Diagnosis not present

## 2019-06-18 DIAGNOSIS — I469 Cardiac arrest, cause unspecified: Secondary | ICD-10-CM | POA: Diagnosis not present

## 2019-06-18 NOTE — Progress Notes (Signed)
Pulmonary Critical Care Medicine [facility] [place]   PULMONARY CRITICAL CARE SERVICE  PROGRESS NOTE  Date of Service: 06/18/2019  Garrett Zimmerman  OTL:[id number]  DOB: September 05, 1945   DOA: 05/19/2019  Referring Physician: Carron Curie, MD  HPI: Garrett Zimmerman is a 74 y.o. male seen for follow up of Acute on Chronic Respiratory Failure.  Patient currently is on T collar has been on 35% FiO2 with good saturations noted  Medications: Reviewed on Rounds  Physical Exam:  Vitals: Temperature is 97.4 pulse 117 respiratory 20 blood pressure is 148/82 saturations 97%  Ventilator Settings on T collar FiO2 35%  . General: Comfortable at this time . Eyes: Grossly normal lids, irises & conjunctiva . ENT: grossly tongue is normal . Neck: no obvious mass . Cardiovascular: S1 S2 normal no gallop . Respiratory: No rhonchi coarse breath sounds are noted . Abdomen: soft . Skin: no rash seen on limited exam . Musculoskeletal: not rigid . Psychiatric:unable to assess . Neurologic: no seizure no involuntary movements         Lab Data:   Basic Metabolic Panel: Recent Labs  Lab 06/16/19 0613  NA 141  K 4.0  CL 101  CO2 30  GLUCOSE 125*  BUN 19  CREATININE 0.72  CALCIUM 9.7    ABG: No results for input(s): PHART, PCO2ART, PO2ART, HCO3, O2SAT in the last 168 hours.  Liver Function Tests: No results for input(s): AST, ALT, ALKPHOS, BILITOT, PROT, ALBUMIN in the last 168 hours. No results for input(s): LIPASE, AMYLASE in the last 168 hours. No results for input(s): AMMONIA in the last 168 hours.  CBC: Recent Labs  Lab 06/16/19 0613  WBC 7.8  HGB 9.6*  HCT 32.1*  MCV 91.5  PLT 248    Cardiac Enzymes: No results for input(s): CKTOTAL, CKMB, CKMBINDEX, TROPONINI in the last 168 hours.  BNP (last 3 results) No results for input(s): BNP in the last 8760 hours.  ProBNP (last 3 results) No results for input(s): PROBNP in the last 8760 hours.  Radiological  Exams: No results found.  Assessment/Plan Active Problems:   Acute on chronic respiratory failure with hypoxia (HCC)   Ventilator associated pneumonia (HCC)   COPD, severe (HCC)   Left acute arterial ischemic stroke, MCA (middle cerebral artery) (HCC)   Cardiac arrest (HCC)   1. Acute on chronic respiratory failure with hypoxia patient currently is on T collar has been on 35% FiO2 good saturations are noted.  Patient was attempted on Amiodarone has been reportedly having copious secretions so therefore not tolerating capping. 2. Ventilator associated pneumonia treated we will continue with supportive care 3. Severe COPD at baseline we will continue to follow 4. Left-sided acute stroke no change 5. Cardiac arrest rhythm is stable we will continue to monitor   I have personally seen and evaluated the patient, evaluated laboratory and imaging results, formulated the assessment and plan and placed orders. The Patient requires high complexity decision making with multiple systems involvement.  Rounds were done with the Respiratory Therapy Director and Staff therapists and discussed with nursing staff also.  [name], MD [facility] Pulmonary Critical Care Medicine Sleep Medicine

## 2019-06-19 ENCOUNTER — Other Ambulatory Visit (HOSPITAL_COMMUNITY): Payer: Medicare Other

## 2019-06-19 DIAGNOSIS — J449 Chronic obstructive pulmonary disease, unspecified: Secondary | ICD-10-CM | POA: Diagnosis not present

## 2019-06-19 DIAGNOSIS — J9621 Acute and chronic respiratory failure with hypoxia: Secondary | ICD-10-CM | POA: Diagnosis not present

## 2019-06-19 DIAGNOSIS — I469 Cardiac arrest, cause unspecified: Secondary | ICD-10-CM | POA: Diagnosis not present

## 2019-06-19 DIAGNOSIS — I63512 Cerebral infarction due to unspecified occlusion or stenosis of left middle cerebral artery: Secondary | ICD-10-CM | POA: Diagnosis not present

## 2019-06-19 NOTE — Progress Notes (Signed)
Referring Physician(s): Dr Sharyon Medicus  Supervising Physician: Malachy Moan  Patient Status:  [facility]  Chief Complaint:  Right empyema Chest tube drain placed 4/126 in IR  Subjective:  Better daily Moving better- PT encouraged OP in Pleurvac 1 L-- brown/serous color No airleak CXR 5/10: IMPRESSION: 1. Stable right chest tube with no pneumothorax. 2. Stable tracheostomy tube and ventilation with mild atelectasis suspected.  Allergies: Patient has no allergy information on record.  Medications: Prior to Admission medications   Not on File     Vital Signs: BP 135/75   Pulse (!) 101   SpO2 94%   Physical Exam Skin:    General: Skin is warm and dry.     Comments: Site is clean and dry No bleeding No air leak 1 L purulent/seous color fluid in Vac      Imaging: DG CHEST PORT 1 VIEW  Result Date: 06/16/2019 CLINICAL DATA:  74 year old male treated for right lung empyema with CT-guided chest tube drainage. EXAM: PORTABLE CHEST 1 VIEW COMPARISON:  Portable chest 05/13/2019 and earlier. FINDINGS: Portable AP semi upright view at 0555 hours. Stable right lower chest pigtail pleural catheter. Stable tracheostomy tube. Stable left chest pacemaker. No pneumothorax. Bilateral ventilation is stable. Mild patchy bibasilar opacity. Stable visualized osseous structures. IMPRESSION: 1. Stable right chest tube with no pneumothorax. 2. Stable tracheostomy tube and ventilation with mild atelectasis suspected. Electronically Signed   By: Odessa Fleming M.D.   On: 06/16/2019 07:43   DG Humerus Left  Result Date: 06/16/2019 CLINICAL DATA:  74 year old male with left arm stiffness and weakness. EXAM: LEFT HUMERUS - 2+ VIEW COMPARISON:  Portable chest radiographs 06/12/2019 and earlier. FINDINGS: Portable AP and lateral views of the left humerus. Left chest cardiac pacemaker again. IV access at the left antecubital fossa. Bone mineralization is within normal limits for age. The left  humerus appears intact with grossly normal alignment at the left shoulder and elbow. Visible left shoulder osseous structures appear grossly intact. There is a small ossific fragment at the tip of a degenerative proximal ulna osteophyte. But no definite regional soft tissue swelling. IMPRESSION: 1. Negative left humerus. 2. Cannot exclude small recent fracture at the tip of a degenerative osteophyte off the proximal ulna. Electronically Signed   By: Odessa Fleming M.D.   On: 06/16/2019 07:37    Labs:  CBC: Recent Labs    05/31/19 0731 06/05/19 0354 06/09/19 0648 06/16/19 0613  WBC 13.4* 11.2* 10.5 7.8  HGB 9.4* 9.5* 10.0* 9.6*  HCT 31.7* 31.8* 32.6* 32.1*  PLT 332 362 286 248    COAGS: No results for input(s): INR, APTT in the last 8760 hours.  BMP: Recent Labs    05/31/19 0731 06/05/19 0354 06/09/19 0648 06/16/19 0613  NA 142 144 145 141  K 4.8 4.2 3.9 4.0  CL 103 102 105 101  CO2 26 32 31 30  GLUCOSE 132* 142* 130* 125*  BUN 13 21 24* 19  CALCIUM 9.2 9.6 9.9 9.7  CREATININE 0.38* 0.53* 0.65 0.72  GFRNONAA >60 >60 >60 >60  GFRAA >60 >60 >60 >60    LIVER FUNCTION TESTS: No results for input(s): BILITOT, AST, ALT, ALKPHOS, PROT, ALBUMIN in the last 8760 hours.  Assessment and Plan:  Rt chest tube intact 1 L in Pleurvac For cxr today  Electronically Signed: Robet Leu, PA-C 06/19/2019, 9:02 AM   I spent a total of 15 Minutes at the the patient's bedside AND on the patient's hospital floor  or unit, greater than 50% of which was counseling/coordinating care for Rt empyema/chest tube

## 2019-06-19 NOTE — Progress Notes (Signed)
Pulmonary Critical Care Medicine [facility] [place]   PULMONARY CRITICAL CARE SERVICE  PROGRESS NOTE  Date of Service: 06/19/2019  EWART CARRERA  GUY:[id number]  DOB: February 15, 1945   DOA: 05/19/2019  Referring Physician: Carron Curie, MD  HPI: KULE GASCOIGNE is a 74 y.o. male seen for follow up of Acute on Chronic Respiratory Failure.  Patient currently is on T collar has been on 28% FiO2 secretions are still significant reportedly  Medications: Reviewed on Rounds  Physical Exam:  Vitals: Temperature 97.0 pulse 98 respiratory rate 30 blood pressure is 130/70 saturations 97%  Ventilator Settings on T collar with an FiO2 of 28%  . General: Comfortable at this time . Eyes: Grossly normal lids, irises & conjunctiva . ENT: grossly tongue is normal . Neck: no obvious mass . Cardiovascular: S1 S2 normal no gallop . Respiratory: Coarse breath sounds with few scattered rhonchi . Abdomen: soft . Skin: no rash seen on limited exam . Musculoskeletal: not rigid . Psychiatric:unable to assess . Neurologic: no seizure no involuntary movements         Lab Data:   Basic Metabolic Panel: Recent Labs  Lab 06/16/19 0613  NA 141  K 4.0  CL 101  CO2 30  GLUCOSE 125*  BUN 19  CREATININE 0.72  CALCIUM 9.7    ABG: No results for input(s): PHART, PCO2ART, PO2ART, HCO3, O2SAT in the last 168 hours.  Liver Function Tests: No results for input(s): AST, ALT, ALKPHOS, BILITOT, PROT, ALBUMIN in the last 168 hours. No results for input(s): LIPASE, AMYLASE in the last 168 hours. No results for input(s): AMMONIA in the last 168 hours.  CBC: Recent Labs  Lab 06/16/19 0613  WBC 7.8  HGB 9.6*  HCT 32.1*  MCV 91.5  PLT 248    Cardiac Enzymes: No results for input(s): CKTOTAL, CKMB, CKMBINDEX, TROPONINI in the last 168 hours.  BNP (last 3 results) No results for input(s): BNP in the last 8760 hours.  ProBNP (last 3 results) No results for input(s): PROBNP in the last  8760 hours.  Radiological Exams: DG Chest Port 1 View  Result Date: 06/19/2019 CLINICAL DATA:  Chest tube.  Empyema. EXAM: PORTABLE CHEST 1 VIEW COMPARISON:  06/16/2019 FINDINGS: Tracheostomy as seen previously. Right pleural catheter at the inferior pleural space. No pneumothorax. Mild bibasilar atelectasis persists. Dual lead pacemaker. Coronary artery stent. Multiple leads overlie the chest. IMPRESSION: No apparent change since study of 3 days ago. No right pneumothorax. Mild bibasilar atelectasis. Electronically Signed   By: Paulina Fusi M.D.   On: 06/19/2019 10:03    Assessment/Plan Active Problems:   Acute on chronic respiratory failure with hypoxia (HCC)   Ventilator associated pneumonia (HCC)   COPD, severe (HCC)   Left acute arterial ischemic stroke, MCA (middle cerebral artery) (HCC)   Cardiac arrest (HCC)   1. Acute on chronic respiratory failure with hypoxia we will continue with T collar trials wean as tolerated.  Patient still has copious secretions which is the limiting factor right now. 2. Ventilator associated pneumonia treated continue to monitor. 3. Severe COPD at baseline we will continue with supportive care 4. Cardiac arrest rhythm is stable we will continue with supportive care   I have personally seen and evaluated the patient, evaluated laboratory and imaging results, formulated the assessment and plan and placed orders. The Patient requires high complexity decision making with multiple systems involvement.  Rounds were done with the Respiratory Therapy Director and Staff therapists and discussed with nursing  staff also.  Allyne Gee, MD [facility] Pulmonary Critical Care Medicine Sleep Medicine

## 2019-06-20 ENCOUNTER — Other Ambulatory Visit (HOSPITAL_COMMUNITY): Payer: Medicare Other

## 2019-06-20 DIAGNOSIS — I469 Cardiac arrest, cause unspecified: Secondary | ICD-10-CM | POA: Diagnosis not present

## 2019-06-20 DIAGNOSIS — J449 Chronic obstructive pulmonary disease, unspecified: Secondary | ICD-10-CM | POA: Diagnosis not present

## 2019-06-20 DIAGNOSIS — J9621 Acute and chronic respiratory failure with hypoxia: Secondary | ICD-10-CM | POA: Diagnosis not present

## 2019-06-20 DIAGNOSIS — I63512 Cerebral infarction due to unspecified occlusion or stenosis of left middle cerebral artery: Secondary | ICD-10-CM | POA: Diagnosis not present

## 2019-06-20 LAB — BLOOD GAS, ARTERIAL
Acid-Base Excess: 8.4 mmol/L — ABNORMAL HIGH (ref 0.0–2.0)
Bicarbonate: 32.8 mmol/L — ABNORMAL HIGH (ref 20.0–28.0)
Drawn by: 243969
FIO2: 60
O2 Saturation: 93.9 %
Patient temperature: 37.7
pCO2 arterial: 50.2 mmHg — ABNORMAL HIGH (ref 32.0–48.0)
pH, Arterial: 7.434 (ref 7.350–7.450)
pO2, Arterial: 76.9 mmHg — ABNORMAL LOW (ref 83.0–108.0)

## 2019-06-20 NOTE — Progress Notes (Addendum)
Pulmonary Critical Care Medicine [facility] [place]   PULMONARY CRITICAL CARE SERVICE  PROGRESS NOTE  Date of Service: 06/20/2019  Garrett Zimmerman  [id number]:[id number]  DOB: 1945-11-29   DOA: 05/19/2019  Referring Physician: Carron Curie, MD  HPI: Garrett Zimmerman is a 74 y.o. male seen for follow up of Acute on Chronic Respiratory Failure. Patient remains on 28% ATC.  Sating well with no distress.   Medications: Reviewed on Rounds  Physical Exam:  Vitals: Pulse 100 resp 23, bp 133/72, o2sat 994%, temp 98.4  Ventilator Settings 28 ATC  . General: Comfortable at this time . Eyes: Grossly normal lids, irises & conjunctiva . ENT: grossly tongue is normal . Neck: no obvious mass . Cardiovascular: S1 S2 normal no gallop . Respiratory: no rales or ronchi noted . Abdomen: soft . Skin: no rash seen on limited exam . Musculoskeletal: not rigid . Psychiatric:unable to assess . Neurologic: no seizure no involuntary movements         Lab Data:   Basic Metabolic Panel: Recent Labs  Lab 06/16/19 0613  NA 141  K 4.0  CL 101  CO2 30  GLUCOSE 125*  BUN 19  CREATININE 0.72  CALCIUM 9.7    ABG: No results for input(s): PHART, PCO2ART, PO2ART, HCO3, O2SAT in the last 168 hours.  Liver Function Tests: No results for input(s): AST, ALT, ALKPHOS, BILITOT, PROT, ALBUMIN in the last 168 hours. No results for input(s): LIPASE, AMYLASE in the last 168 hours. No results for input(s): AMMONIA in the last 168 hours.  CBC: Recent Labs  Lab 06/16/19 0613  WBC 7.8  HGB 9.6*  HCT 32.1*  MCV 91.5  PLT 248    Cardiac Enzymes: No results for input(s): CKTOTAL, CKMB, CKMBINDEX, TROPONINI in the last 168 hours.  BNP (last 3 results) No results for input(s): BNP in the last 8760 hours.  ProBNP (last 3 results) No results for input(s): PROBNP in the last 8760 hours.  Radiological Exams: DG Chest Port 1 View  Result Date: 06/19/2019 CLINICAL DATA:  Chest tube.  Empyema.  EXAM: PORTABLE CHEST 1 VIEW COMPARISON:  06/16/2019 FINDINGS: Tracheostomy as seen previously. Right pleural catheter at the inferior pleural space. No pneumothorax. Mild bibasilar atelectasis persists. Dual lead pacemaker. Coronary artery stent. Multiple leads overlie the chest. IMPRESSION: No apparent change since study of 3 days ago. No right pneumothorax. Mild bibasilar atelectasis. Electronically Signed   By: Paulina Fusi M.D.   On: 06/19/2019 10:03    Assessment/Plan Active Problems:   Acute on chronic respiratory failure with hypoxia (HCC)   Ventilator associated pneumonia (HCC)   COPD, severe (HCC)   Left acute arterial ischemic stroke, MCA (middle cerebral artery) (HCC)   Cardiac arrest (HCC)   1. Acute on chronic respiratory failure with hypoxia we will continue with T collar trials wean as tolerated.  Patient still has copious secretions which is the limiting factor right now. 2. Ventilator associated pneumonia treated continue to monitor. 3. Severe COPD at baseline we will continue with supportive care 4. Cardiac arrest rhythm is stable we will continue with supportive care   I have personally seen and evaluated the patient, evaluated laboratory and imaging results, formulated the assessment and plan and placed orders. The Patient requires high complexity decision making with multiple systems involvement.  Rounds were done with the Respiratory Therapy Director and Staff therapists and discussed with nursing staff also.  [name], MD [facility] Pulmonary Critical Care Medicine Sleep Medicine

## 2019-06-21 DIAGNOSIS — I63512 Cerebral infarction due to unspecified occlusion or stenosis of left middle cerebral artery: Secondary | ICD-10-CM | POA: Diagnosis not present

## 2019-06-21 DIAGNOSIS — J449 Chronic obstructive pulmonary disease, unspecified: Secondary | ICD-10-CM | POA: Diagnosis not present

## 2019-06-21 DIAGNOSIS — J9621 Acute and chronic respiratory failure with hypoxia: Secondary | ICD-10-CM | POA: Diagnosis not present

## 2019-06-21 DIAGNOSIS — I469 Cardiac arrest, cause unspecified: Secondary | ICD-10-CM | POA: Diagnosis not present

## 2019-06-21 NOTE — Progress Notes (Addendum)
Pulmonary Critical Care Medicine [facility] [place]   PULMONARY CRITICAL CARE SERVICE  PROGRESS NOTE  Date of Service: 06/21/2019  [name]  IWP:[id number]  DOB: 27-Jun-1945   DOA: 05/19/2019  Referring Physician: Carron Curie, MD  HPI: Garrett Zimmerman is a 74 y.o. male seen for follow up of Acute on Chronic Respiratory Failure.  Patient remains on 40% aerosol trach collar satting well no fever or distress.  Medications: Reviewed on Zimmerman  Physical Exam:  Vitals: Pulse 96 respirations 23 BP 142/56 O2 sat 97% temp 97 point  Ventilator Settings ATC 40%  . General: Comfortable at this time . Eyes: Grossly normal lids, irises & conjunctiva . ENT: grossly tongue is normal . Neck: no obvious mass . Cardiovascular: S1 S2 normal no gallop . Respiratory: No rales or rhonchi noted . Abdomen: soft . Skin: no rash seen on limited exam . Musculoskeletal: not rigid . Psychiatric:unable to assess . Neurologic: no seizure no involuntary movements         Lab Data:   Basic Metabolic Panel: Recent Labs  Lab 06/16/19 0613  NA 141  K 4.0  CL 101  CO2 30  GLUCOSE 125*  BUN 19  CREATININE 0.72  CALCIUM 9.7    ABG: Recent Labs  Lab 06/20/19 1946  PHART 7.434  PCO2ART 50.2*  PO2ART 76.9*  HCO3 32.8*  O2SAT 93.9    Liver Function Tests: No results for input(s): AST, ALT, ALKPHOS, BILITOT, PROT, ALBUMIN in the last 168 hours. No results for input(s): LIPASE, AMYLASE in the last 168 hours. No results for input(s): AMMONIA in the last 168 hours.  CBC: Recent Labs  Lab 06/16/19 0613  WBC 7.8  HGB 9.6*  HCT 32.1*  MCV 91.5  PLT 248    Cardiac Enzymes: No results for input(s): CKTOTAL, CKMB, CKMBINDEX, TROPONINI in the last 168 hours.  BNP (last 3 results) No results for input(s): BNP in the last 8760 hours.  ProBNP (last 3 results) No results for input(s): PROBNP in the last 8760 hours.  Radiological Exams: DG CHEST PORT 1 VIEW  Result  Date: 06/20/2019 CLINICAL DATA:  Pneumothorax EXAM: PORTABLE CHEST 1 VIEW COMPARISON:  06/19/2019 FINDINGS: Right basilar chest tube in place. No visible pneumothorax. Tracheostomy and left pacer are unchanged. Heart is normal size. Patchy bilateral lower lobe airspace opacities, slightly improved on the right since prior study. IMPRESSION: Right basilar chest tube.  No visible pneumothorax. Bibasilar airspace opacities, improving on the right. Electronically Signed   By: Charlett Nose M.D.   On: 06/20/2019 19:08    Assessment/Plan Active Problems:   Acute on chronic respiratory failure with hypoxia (HCC)   Ventilator associated pneumonia (HCC)   COPD, severe (HCC)   Left acute arterial ischemic stroke, MCA (middle cerebral artery) (HCC)   Cardiac arrest (HCC)   1. Acute on chronic respiratory failure with hypoxia we will continue with T collar trials wean as tolerated.  Continue with pulmonary toilet and secretion management. 2. Ventilator associated pneumonia treated continue to monitor. 3. Severe COPD at baseline we will continue with supportive care 4. Cardiac arrest rhythm is stable we will continue with supportive care   I have personally seen and evaluated the patient, evaluated laboratory and imaging results, formulated the assessment and plan and placed orders. The Patient requires high complexity decision making with multiple systems involvement.  Zimmerman were done with the Respiratory Therapy Director and Staff therapists and discussed with nursing staff also.  Garrett Pax, MD  [facility] Pulmonary Critical Care Medicine Sleep Medicine

## 2019-06-22 DIAGNOSIS — I469 Cardiac arrest, cause unspecified: Secondary | ICD-10-CM | POA: Diagnosis not present

## 2019-06-22 DIAGNOSIS — J9621 Acute and chronic respiratory failure with hypoxia: Secondary | ICD-10-CM | POA: Diagnosis not present

## 2019-06-22 DIAGNOSIS — J449 Chronic obstructive pulmonary disease, unspecified: Secondary | ICD-10-CM | POA: Diagnosis not present

## 2019-06-22 DIAGNOSIS — I63512 Cerebral infarction due to unspecified occlusion or stenosis of left middle cerebral artery: Secondary | ICD-10-CM | POA: Diagnosis not present

## 2019-06-22 LAB — CBC
HCT: 32 % — ABNORMAL LOW (ref 39.0–52.0)
Hemoglobin: 9.6 g/dL — ABNORMAL LOW (ref 13.0–17.0)
MCH: 27.5 pg (ref 26.0–34.0)
MCHC: 30 g/dL (ref 30.0–36.0)
MCV: 91.7 fL (ref 80.0–100.0)
Platelets: 311 10*3/uL (ref 150–400)
RBC: 3.49 MIL/uL — ABNORMAL LOW (ref 4.22–5.81)
RDW: 19.7 % — ABNORMAL HIGH (ref 11.5–15.5)
WBC: 10.4 10*3/uL (ref 4.0–10.5)
nRBC: 0 % (ref 0.0–0.2)

## 2019-06-22 LAB — COMPREHENSIVE METABOLIC PANEL
ALT: 14 U/L (ref 0–44)
AST: 19 U/L (ref 15–41)
Albumin: 2.1 g/dL — ABNORMAL LOW (ref 3.5–5.0)
Alkaline Phosphatase: 119 U/L (ref 38–126)
Anion gap: 9 (ref 5–15)
BUN: 27 mg/dL — ABNORMAL HIGH (ref 8–23)
CO2: 32 mmol/L (ref 22–32)
Calcium: 9.9 mg/dL (ref 8.9–10.3)
Chloride: 104 mmol/L (ref 98–111)
Creatinine, Ser: 0.76 mg/dL (ref 0.61–1.24)
GFR calc Af Amer: >60 mL/min (ref >60)
GFR calc non Af Amer: >60 mL/min (ref >60)
Glucose, Bld: 153 mg/dL — ABNORMAL HIGH (ref 70–99)
Potassium: 4 mmol/L (ref 3.5–5.1)
Sodium: 145 mmol/L (ref 135–145)
Total Bilirubin: 0.3 mg/dL (ref 0.3–1.2)
Total Protein: 5.7 g/dL — ABNORMAL LOW (ref 6.5–8.1)

## 2019-06-22 NOTE — Progress Notes (Addendum)
Pulmonary Critical Care Medicine [facility] [place]   PULMONARY CRITICAL CARE SERVICE  PROGRESS NOTE  Date of Service: 06/22/2019  Garrett Zimmerman  PYK:[id number]  DOB: 01-05-1946   DOA: 05/19/2019  Referring Physician: Carron Curie, MD  HPI: Garrett Zimmerman is a 74 y.o. male seen for follow up of Acute on Chronic Respiratory Failure.  Patient continues to be unable to use PMV due to secretions.  Remains on 40% aerosol trach collar satting well with no distress.  Medications: Reviewed on Rounds  Physical Exam:  Vitals: Pulse 107 respirations 21 BP 133/72 O2 sat 93% temp 97 7  Ventilator Settings 40% ATC  . General: Comfortable at this time . Eyes: Grossly normal lids, irises & conjunctiva . ENT: grossly tongue is normal . Neck: no obvious mass . Cardiovascular: S1 S2 normal no gallop . Respiratory: No rales or rhonchi noted . Abdomen: soft . Skin: no rash seen on limited exam . Musculoskeletal: not rigid . Psychiatric:unable to assess . Neurologic: no seizure no involuntary movements         Lab Data:   Basic Metabolic Panel: Recent Labs  Lab 06/16/19 0613 06/22/19 0438  NA 141 145  K 4.0 4.0  CL 101 104  CO2 30 32  GLUCOSE 125* 153*  BUN 19 27*  CREATININE 0.72 0.76  CALCIUM 9.7 9.9    ABG: Recent Labs  Lab 06/20/19 1946  PHART 7.434  PCO2ART 50.2*  PO2ART 76.9*  HCO3 32.8*  O2SAT 93.9    Liver Function Tests: Recent Labs  Lab 06/22/19 0438  AST 19  ALT 14  ALKPHOS 119  BILITOT 0.3  PROT 5.7*  ALBUMIN 2.1*   No results for input(s): LIPASE, AMYLASE in the last 168 hours. No results for input(s): AMMONIA in the last 168 hours.  CBC: Recent Labs  Lab 06/16/19 0613 06/22/19 0438  WBC 7.8 10.4  HGB 9.6* 9.6*  HCT 32.1* 32.0*  MCV 91.5 91.7  PLT 248 311    Cardiac Enzymes: No results for input(s): CKTOTAL, CKMB, CKMBINDEX, TROPONINI in the last 168 hours.  BNP (last 3 results) No results for input(s): BNP in the last  8760 hours.  ProBNP (last 3 results) No results for input(s): PROBNP in the last 8760 hours.  Radiological Exams: DG CHEST PORT 1 VIEW  Result Date: 06/20/2019 CLINICAL DATA:  Pneumothorax EXAM: PORTABLE CHEST 1 VIEW COMPARISON:  06/19/2019 FINDINGS: Right basilar chest tube in place. No visible pneumothorax. Tracheostomy and left pacer are unchanged. Heart is normal size. Patchy bilateral lower lobe airspace opacities, slightly improved on the right since prior study. IMPRESSION: Right basilar chest tube.  No visible pneumothorax. Bibasilar airspace opacities, improving on the right. Electronically Signed   By: Charlett Nose M.D.   On: 06/20/2019 19:08    Assessment/Plan Active Problems:   Acute on chronic respiratory failure with hypoxia (HCC)   Ventilator associated pneumonia (HCC)   COPD, severe (HCC)   Left acute arterial ischemic stroke, MCA (middle cerebral artery) (HCC)   Cardiac arrest (HCC)   1. Acute on chronic respiratory failure with hypoxia we will continue with T collar trials wean as tolerated.  We will continue aggressive pulmonary toilet secretion management 2. Ventilator associated pneumonia treated continue to monitor. 3. Severe COPD at baseline we will continue with supportive care 4. Cardiac arrest rhythm is stable we will continue with supportive care   I have personally seen and evaluated the patient, evaluated laboratory and imaging results, formulated the assessment  and plan and placed orders. The Patient requires high complexity decision making with multiple systems involvement.  Rounds were done with the Respiratory Therapy Director and Staff therapists and discussed with nursing staff also.  Allyne Gee, MD [facility] Pulmonary Critical Care Medicine Sleep Medicine

## 2019-06-23 DIAGNOSIS — I469 Cardiac arrest, cause unspecified: Secondary | ICD-10-CM | POA: Diagnosis not present

## 2019-06-23 DIAGNOSIS — J9621 Acute and chronic respiratory failure with hypoxia: Secondary | ICD-10-CM | POA: Diagnosis not present

## 2019-06-23 DIAGNOSIS — J449 Chronic obstructive pulmonary disease, unspecified: Secondary | ICD-10-CM | POA: Diagnosis not present

## 2019-06-23 DIAGNOSIS — I63512 Cerebral infarction due to unspecified occlusion or stenosis of left middle cerebral artery: Secondary | ICD-10-CM | POA: Diagnosis not present

## 2019-06-23 NOTE — Progress Notes (Signed)
Pulmonary Critical Care Medicine [facility] [place]   PULMONARY CRITICAL CARE SERVICE  PROGRESS NOTE  Date of Service: 06/23/2019  Garrett Zimmerman  OMV:[id number]  DOB: 07/05/1945   DOA: 05/19/2019  Referring Physician: Carron Curie, MD  HPI: Garrett Zimmerman is a 74 y.o. male seen for follow up of Acute on Chronic Respiratory Failure.  Patient currently is on T collar has been on 35% FiO2 using PMV  Medications: Reviewed on Rounds  Physical Exam:  Vitals: Temperature is 97.0 pulse 91 respiratory 21 blood pressure is 159/89 saturations 97%  Ventilator Settings off ventilator on T collar with an FiO2 of 35%  . General: Comfortable at this time . Eyes: Grossly normal lids, irises & conjunctiva . ENT: grossly tongue is normal . Neck: no obvious mass . Cardiovascular: S1 S2 normal no gallop . Respiratory: No rhonchi coarse breath sounds . Abdomen: soft . Skin: no rash seen on limited exam . Musculoskeletal: not rigid . Psychiatric:unable to assess . Neurologic: no seizure no involuntary movements         Lab Data:   Basic Metabolic Panel: Recent Labs  Lab 06/22/19 0438  NA 145  K 4.0  CL 104  CO2 32  GLUCOSE 153*  BUN 27*  CREATININE 0.76  CALCIUM 9.9    ABG: Recent Labs  Lab 06/20/19 1946  PHART 7.434  PCO2ART 50.2*  PO2ART 76.9*  HCO3 32.8*  O2SAT 93.9    Liver Function Tests: Recent Labs  Lab 06/22/19 0438  AST 19  ALT 14  ALKPHOS 119  BILITOT 0.3  PROT 5.7*  ALBUMIN 2.1*   No results for input(s): LIPASE, AMYLASE in the last 168 hours. No results for input(s): AMMONIA in the last 168 hours.  CBC: Recent Labs  Lab 06/22/19 0438  WBC 10.4  HGB 9.6*  HCT 32.0*  MCV 91.7  PLT 311    Cardiac Enzymes: No results for input(s): CKTOTAL, CKMB, CKMBINDEX, TROPONINI in the last 168 hours.  BNP (last 3 results) No results for input(s): BNP in the last 8760 hours.  ProBNP (last 3 results) No results for input(s): PROBNP in the  last 8760 hours.  Radiological Exams: No results found.  Assessment/Plan Active Problems:   Acute on chronic respiratory failure with hypoxia (HCC)   Ventilator associated pneumonia (HCC)   COPD, severe (HCC)   Left acute arterial ischemic stroke, MCA (middle cerebral artery) (HCC)   Cardiac arrest (HCC)   1. Acute on chronic respiratory failure hypoxia continue T collar trials patient currently is on 35% FiO2 using PMV 2. Ventilator associated pneumonia treated clinically is improving 3. Severe COPD at baseline continue supportive care 4. Left sided stroke no change therapy as tolerated 5. Cardiac arrest supportive care   I have personally seen and evaluated the patient, evaluated laboratory and imaging results, formulated the assessment and plan and placed orders. The Patient requires high complexity decision making with multiple systems involvement.  Rounds were done with the Respiratory Therapy Director and Staff therapists and discussed with nursing staff also.  [name], MD [facility] Pulmonary Critical Care Medicine Sleep Medicine

## 2019-06-24 ENCOUNTER — Other Ambulatory Visit (HOSPITAL_COMMUNITY): Payer: Medicare Other

## 2019-06-24 DIAGNOSIS — J9621 Acute and chronic respiratory failure with hypoxia: Secondary | ICD-10-CM | POA: Diagnosis not present

## 2019-06-24 DIAGNOSIS — I469 Cardiac arrest, cause unspecified: Secondary | ICD-10-CM | POA: Diagnosis not present

## 2019-06-24 DIAGNOSIS — J449 Chronic obstructive pulmonary disease, unspecified: Secondary | ICD-10-CM | POA: Diagnosis not present

## 2019-06-24 DIAGNOSIS — I63512 Cerebral infarction due to unspecified occlusion or stenosis of left middle cerebral artery: Secondary | ICD-10-CM | POA: Diagnosis not present

## 2019-06-24 NOTE — Progress Notes (Signed)
Pulmonary Critical Care Medicine [facility] [place]   PULMONARY CRITICAL CARE SERVICE  PROGRESS NOTE  Date of Service: 06/24/2019  Garrett Zimmerman  LOV:[id number]  DOB: 11/20/1945   DOA: 05/19/2019  Referring Physician: Carron Curie, MD  HPI: Garrett Zimmerman is a 74 y.o. male seen for follow up of Acute on Chronic Respiratory Failure.  Patient currently is on T collar has been on 35% FiO2 with a nose comfortable without distress at this time  Medications: Reviewed on Rounds  Physical Exam:  Vitals: Temperature is 97.3 pulse 86 respiratory 18 blood pressure is 122/75 saturations 98%  Ventilator Settings on T collar with an FiO2 of 35%  . General: Comfortable at this time . Eyes: Grossly normal lids, irises & conjunctiva . ENT: grossly tongue is normal . Neck: no obvious mass . Cardiovascular: S1 S2 normal no gallop . Respiratory: No rhonchi no rales are noted at this time . Abdomen: soft . Skin: no rash seen on limited exam . Musculoskeletal: not rigid . Psychiatric:unable to assess . Neurologic: no seizure no involuntary movements         Lab Data:   Basic Metabolic Panel: Recent Labs  Lab 06/22/19 0438  NA 145  K 4.0  CL 104  CO2 32  GLUCOSE 153*  BUN 27*  CREATININE 0.76  CALCIUM 9.9    ABG: Recent Labs  Lab 06/20/19 1946  PHART 7.434  PCO2ART 50.2*  PO2ART 76.9*  HCO3 32.8*  O2SAT 93.9    Liver Function Tests: Recent Labs  Lab 06/22/19 0438  AST 19  ALT 14  ALKPHOS 119  BILITOT 0.3  PROT 5.7*  ALBUMIN 2.1*   No results for input(s): LIPASE, AMYLASE in the last 168 hours. No results for input(s): AMMONIA in the last 168 hours.  CBC: Recent Labs  Lab 06/22/19 0438  WBC 10.4  HGB 9.6*  HCT 32.0*  MCV 91.7  PLT 311    Cardiac Enzymes: No results for input(s): CKTOTAL, CKMB, CKMBINDEX, TROPONINI in the last 168 hours.  BNP (last 3 results) No results for input(s): BNP in the last 8760 hours.  ProBNP (last 3  results) No results for input(s): PROBNP in the last 8760 hours.  Radiological Exams: No results found.  Assessment/Plan Active Problems:   Acute on chronic respiratory failure with hypoxia (HCC)   Ventilator associated pneumonia (HCC)   COPD, severe (HCC)   Left acute arterial ischemic stroke, MCA (middle cerebral artery) (HCC)   Cardiac arrest (HCC)   1. Acute on chronic respiratory failure with hypoxia we will continue with T collar trials on 35% FiO2 titrate oxygen down as tolerated use PMV also as tolerated 2. Ventilator associated pneumonia treated clinically improved 3. Severe COPD medical management 4. Left-sided acute stroke no change we will continue with supportive care 5. Cardiac arrest rhythm has been stable   I have personally seen and evaluated the patient, evaluated laboratory and imaging results, formulated the assessment and plan and placed orders. The Patient requires high complexity decision making with multiple systems involvement.  Rounds were done with the Respiratory Therapy Director and Staff therapists and discussed with nursing staff also.  [name], MD [facility] Pulmonary Critical Care Medicine Sleep Medicine

## 2019-06-24 NOTE — Progress Notes (Signed)
Patient ID: Garrett Zimmerman, male   DOB: 12-09-1945, 74 y.o.   MRN: [id number]   Right empyema Chest tube drain placed in IR 05/23/19 Doing well  CXR today: IMPRESSION: Progressive streaky bibasilar opacities, right greater than left. Findings may reflect atelectasis versus infection.   OP minimal for few days afeb No air leak Wbc wnl  Discussed with Dr Loreta Ave Chest tube removal bedside No complication vaseline gauze dressing placed  CXR pending

## 2019-06-25 DIAGNOSIS — I63512 Cerebral infarction due to unspecified occlusion or stenosis of left middle cerebral artery: Secondary | ICD-10-CM | POA: Diagnosis not present

## 2019-06-25 DIAGNOSIS — J9621 Acute and chronic respiratory failure with hypoxia: Secondary | ICD-10-CM | POA: Diagnosis not present

## 2019-06-25 DIAGNOSIS — J449 Chronic obstructive pulmonary disease, unspecified: Secondary | ICD-10-CM | POA: Diagnosis not present

## 2019-06-25 DIAGNOSIS — I469 Cardiac arrest, cause unspecified: Secondary | ICD-10-CM | POA: Diagnosis not present

## 2019-06-25 LAB — CBC
HCT: 32.8 % — ABNORMAL LOW (ref 39.0–52.0)
Hemoglobin: 9.9 g/dL — ABNORMAL LOW (ref 13.0–17.0)
MCH: 27.7 pg (ref 26.0–34.0)
MCHC: 30.2 g/dL (ref 30.0–36.0)
MCV: 91.9 fL (ref 80.0–100.0)
Platelets: 268 10*3/uL (ref 150–400)
RBC: 3.57 MIL/uL — ABNORMAL LOW (ref 4.22–5.81)
RDW: 19.2 % — ABNORMAL HIGH (ref 11.5–15.5)
WBC: 7.1 10*3/uL (ref 4.0–10.5)
nRBC: 0 % (ref 0.0–0.2)

## 2019-06-25 LAB — BASIC METABOLIC PANEL
Anion gap: 9 (ref 5–15)
BUN: 29 mg/dL — ABNORMAL HIGH (ref 8–23)
CO2: 30 mmol/L (ref 22–32)
Calcium: 9.9 mg/dL (ref 8.9–10.3)
Chloride: 106 mmol/L (ref 98–111)
Creatinine, Ser: 0.74 mg/dL (ref 0.61–1.24)
GFR calc Af Amer: >60 mL/min (ref >60)
GFR calc non Af Amer: >60 mL/min (ref >60)
Glucose, Bld: 164 mg/dL — ABNORMAL HIGH (ref 70–99)
Potassium: 3.9 mmol/L (ref 3.5–5.1)
Sodium: 145 mmol/L (ref 135–145)

## 2019-06-25 LAB — CULTURE, RESPIRATORY W GRAM STAIN

## 2019-06-25 NOTE — Progress Notes (Addendum)
Pulmonary Critical Care Medicine [facility] [place]   PULMONARY CRITICAL CARE SERVICE  PROGRESS NOTE  Date of Service: 06/25/2019  Garrett Zimmerman  [id number]:[id number]  DOB: 09/27/1945   DOA: 05/19/2019  Referring Physician: Carron Curie, MD  HPI: Garrett Zimmerman is a 74 y.o. male seen for follow up of Acute on Chronic Respiratory Failure.  Patient remains on 30% aerosol trach collar using PMV with no difficulty satting well no distress.  Medications: Reviewed on Rounds  Physical Exam:  Vitals: Pulse 85 respirations 18 BP 122/63 O2 sat 99% temp 96.8  Ventilator Settings 30% ATC  . General: Comfortable at this time . Eyes: Grossly normal lids, irises & conjunctiva . ENT: grossly tongue is normal . Neck: no obvious mass . Cardiovascular: S1 S2 normal no gallop . Respiratory: No rales or rhonchi noted . Abdomen: soft . Skin: no rash seen on limited exam . Musculoskeletal: not rigid . Psychiatric:unable to assess . Neurologic: no seizure no involuntary movements         Lab Data:   Basic Metabolic Panel: Recent Labs  Lab 06/22/19 0438 06/25/19 0604  NA 145 145  K 4.0 3.9  CL 104 106  CO2 32 30  GLUCOSE 153* 164*  BUN 27* 29*  CREATININE 0.76 0.74  CALCIUM 9.9 9.9    ABG: Recent Labs  Lab 06/20/19 1946  PHART 7.434  PCO2ART 50.2*  PO2ART 76.9*  HCO3 32.8*  O2SAT 93.9    Liver Function Tests: Recent Labs  Lab 06/22/19 0438  AST 19  ALT 14  ALKPHOS 119  BILITOT 0.3  PROT 5.7*  ALBUMIN 2.1*   No results for input(s): LIPASE, AMYLASE in the last 168 hours. No results for input(s): AMMONIA in the last 168 hours.  CBC: Recent Labs  Lab 06/22/19 0438 06/25/19 0604  WBC 10.4 7.1  HGB 9.6* 9.9*  HCT 32.0* 32.8*  MCV 91.7 91.9  PLT 311 268    Cardiac Enzymes: No results for input(s): CKTOTAL, CKMB, CKMBINDEX, TROPONINI in the last 168 hours.  BNP (last 3 results) No results for input(s): BNP in the last 8760 hours.  ProBNP (last 3  results) No results for input(s): PROBNP in the last 8760 hours.  Radiological Exams: DG Chest Port 1 View  Result Date: 06/24/2019 CLINICAL DATA:  Post right side chest tube removal. EXAM: PORTABLE CHEST - 1 VIEW COMPARISON:  Earlier film of the same day FINDINGS: Interval removal of pigtail catheter previously seen at the right lung base. No pneumothorax. 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 Relatively low lung volumes with some linear scarring or atelectasis in the bases right greater than left, slightly increased. Heart size and mediastinal contours are within normal limits. No effusion. Visualized bones unremarkable. IMPRESSION: No pneumothorax post right chest tube removal. Electronically Signed   By: Corlis Leak M.D.   On: 06/24/2019 14:43   DG CHEST PORT 1 VIEW  Result Date: 06/24/2019 CLINICAL DATA:  Lethargy, shortness of breath EXAM: PORTABLE CHEST 1 VIEW COMPARISON:  06/20/2019 FINDINGS: Stable left-sided implanted cardiac device. Unchanged tracheostomy tube. Right basilar pleural drainage catheter projects over the lower right hemithorax. Cardiac silhouette is within normal limits. Atherosclerotic calcification of the aortic knob. Progressive streaky bibasilar opacities, right greater than left. No large pleural fluid collection. No pneumothorax. IMPRESSION: Progressive streaky bibasilar opacities, right greater than left. Findings may reflect atelectasis versus infection. Electronically Signed   By: Duanne Guess D.O.   On: 06/24/2019 12:27  Assessment/Plan Active Problems:   Acute on chronic respiratory failure with hypoxia (HCC)   Ventilator associated pneumonia (HCC)   COPD, severe (HCC)   Left acute arterial ischemic stroke, MCA (middle cerebral artery) ([place])   Cardiac arrest ([place])   1. Acute on chronic respiratory failure with hypoxia we will continue with T collar trials on 30% FiO2 titrate oxygen down as tolerated use PMV also  as tolerated 2. Ventilator associated pneumonia treated clinically improved 3. Severe COPD medical management 4. Left-sided acute stroke no change we will continue with supportive care 5. Cardiac arrest rhythm has been stable   I have personally seen and evaluated the patient, evaluated laboratory and imaging results, formulated the assessment and plan and placed orders. The Patient requires high complexity decision making with multiple systems involvement.  Rounds were done with the Respiratory Therapy Director and Staff therapists and discussed with nursing staff also.  Allyne Gee, MD [facility] Pulmonary Critical Care Medicine Sleep Medicine

## 2019-06-26 DIAGNOSIS — I469 Cardiac arrest, cause unspecified: Secondary | ICD-10-CM | POA: Diagnosis not present

## 2019-06-26 DIAGNOSIS — I63512 Cerebral infarction due to unspecified occlusion or stenosis of left middle cerebral artery: Secondary | ICD-10-CM | POA: Diagnosis not present

## 2019-06-26 DIAGNOSIS — J449 Chronic obstructive pulmonary disease, unspecified: Secondary | ICD-10-CM | POA: Diagnosis not present

## 2019-06-26 DIAGNOSIS — J9621 Acute and chronic respiratory failure with hypoxia: Secondary | ICD-10-CM | POA: Diagnosis not present

## 2019-06-26 NOTE — Progress Notes (Signed)
Pulmonary Critical Care Medicine [place]   PULMONARY CRITICAL CARE SERVICE  PROGRESS NOTE  Date of Service: 06/26/2019  Garrett Zimmerman  [id number]:[id number]  DOB: [birth year]   DOA: 05/19/2019  Referring Physician: Merton Border, MD  HPI: Garrett Zimmerman is a 74 y.o. male seen for follow up of Acute on Chronic Respiratory Failure.  Patient is on T collar currently on 20% FiO2 using the PMV  Medications: Reviewed on Rounds  Physical Exam:  Vitals: Temperature 97.3 pulse 86 respiratory 24 blood pressure is 128/73 saturations 96%  Ventilator Settings on T collar FiO2 28%  . General: Comfortable at this time . Eyes: Grossly normal lids, irises & conjunctiva . ENT: grossly tongue is normal . Neck: no obvious mass . Cardiovascular: S1 S2 normal no gallop . Respiratory: No rhonchi coarse breath sounds . Abdomen: soft . Skin: no rash seen on limited exam . Musculoskeletal: not rigid . Psychiatric:unable to assess . Neurologic: no seizure no involuntary movements         Lab Data:   Basic Metabolic Panel: Recent Labs  Lab 06/22/19 0438 06/25/19 0604  NA 145 145  K 4.0 3.9  CL 104 106  CO2 32 30  GLUCOSE 153* 164*  BUN 27* 29*  CREATININE 0.76 0.74  CALCIUM 9.9 9.9    ABG: Recent Labs  Lab 06/20/19 1946  PHART 7.434  PCO2ART 50.2*  PO2ART 76.9*  HCO3 32.8*  O2SAT 93.9    Liver Function Tests: Recent Labs  Lab 06/22/19 0438  AST 19  ALT 14  ALKPHOS 119  BILITOT 0.3  PROT 5.7*  ALBUMIN 2.1*   No results for input(s): LIPASE, AMYLASE in the last 168 hours. No results for input(s): AMMONIA in the last 168 hours.  CBC: Recent Labs  Lab 06/22/19 0438 06/25/19 0604  WBC 10.4 7.1  HGB 9.6* 9.9*  HCT 32.0* 32.8*  MCV 91.7 91.9  PLT 311 268    Cardiac Enzymes: No results for input(s): CKTOTAL, CKMB, CKMBINDEX, TROPONINI in the last 168 hours.  BNP (last 3 results) No results for input(s): BNP in the last 8760 hours.  ProBNP  (last 3 results) No results for input(s): PROBNP in the last 8760 hours.  Radiological Exams: DG Chest Port 1 View  Result Date: 06/24/2019 CLINICAL DATA:  Post right side chest tube removal. EXAM: PORTABLE CHEST - 1 VIEW COMPARISON:  Earlier film of the same day FINDINGS: Interval removal of pigtail catheter previously seen at the right lung base. No pneumothorax. 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 Relatively low lung volumes with some linear scarring or atelectasis in the bases right greater than left, slightly increased. Heart size and mediastinal contours are within normal limits. No effusion. Visualized bones unremarkable. IMPRESSION: No pneumothorax post right chest tube removal. Electronically Signed   By: Lucrezia Europe M.D.   On: 06/24/2019 14:43    Assessment/Plan Active Problems:   Acute on chronic respiratory failure with hypoxia (HCC)   Ventilator associated pneumonia (HCC)   COPD, severe (HCC)   Left acute arterial ischemic stroke, MCA (middle cerebral artery) ([place])   Cardiac arrest ([place])   1. Acute on chronic respiratory failure hypoxia we will continue with T collar as tolerated.  Patient is also tolerating the PMV.  Secretions are still an issue we will continue aggressive pulmonary toilet. 2. Ventilator associated pneumonia treated we will continue to follow 3. Severe COPD at baseline medical management 4. Left-sided stroke therapy as tolerated  5. Cardiac arrest rhythm has been stable   I have personally seen and evaluated the patient, evaluated laboratory and imaging results, formulated the assessment and plan and placed orders. The Patient requires high complexity decision making with multiple systems involvement.  Rounds were done with the Respiratory Therapy Director and Staff therapists and discussed with nursing staff also.  [name], MD [facility] Pulmonary Critical Care Medicine Sleep Medicine

## 2019-06-27 DIAGNOSIS — I469 Cardiac arrest, cause unspecified: Secondary | ICD-10-CM | POA: Diagnosis not present

## 2019-06-27 DIAGNOSIS — J9621 Acute and chronic respiratory failure with hypoxia: Secondary | ICD-10-CM | POA: Diagnosis not present

## 2019-06-27 DIAGNOSIS — I63512 Cerebral infarction due to unspecified occlusion or stenosis of left middle cerebral artery: Secondary | ICD-10-CM | POA: Diagnosis not present

## 2019-06-27 DIAGNOSIS — J449 Chronic obstructive pulmonary disease, unspecified: Secondary | ICD-10-CM | POA: Diagnosis not present

## 2019-06-27 LAB — VANCOMYCIN, TROUGH: Vancomycin Tr: 21 ug/mL (ref 15–20)

## 2019-06-27 NOTE — Progress Notes (Addendum)
Pulmonary Critical Care Medicine [facility] [place]   PULMONARY CRITICAL CARE SERVICE  PROGRESS NOTE  Date of Service: 06/27/2019  CALLOWAY ANDRUS  YYQ:[id number]  DOB: Jan 11, 1946   DOA: 05/19/2019  Referring Physician: Carron Curie, MD  HPI: Garrett Zimmerman is a 74 y.o. male seen for follow up of Acute on Chronic Respiratory Failure.  Patient mains on 20% aerosol trach collar using PMV with no difficulty satting well.  Medications: Reviewed on Rounds  Physical Exam:  Vitals: Pulse 92 respirations 21 BP 135/73 O2 sat 94% temp 98.2  Ventilator Settings 20% ATC  . General: Comfortable at this time . Eyes: Grossly normal lids, irises & conjunctiva . ENT: grossly tongue is normal . Neck: no obvious mass . Cardiovascular: S1 S2 normal no gallop . Respiratory: No rales or rhonchi noted . Abdomen: soft . Skin: no rash seen on limited exam . Musculoskeletal: not rigid . Psychiatric:unable to assess . Neurologic: no seizure no involuntary movements         Lab Data:   Basic Metabolic Panel: Recent Labs  Lab 06/22/19 0438 06/25/19 0604  NA 145 145  K 4.0 3.9  CL 104 106  CO2 32 30  GLUCOSE 153* 164*  BUN 27* 29*  CREATININE 0.76 0.74  CALCIUM 9.9 9.9    ABG: Recent Labs  Lab 06/20/19 1946  PHART 7.434  PCO2ART 50.2*  PO2ART 76.9*  HCO3 32.8*  O2SAT 93.9    Liver Function Tests: Recent Labs  Lab 06/22/19 0438  AST 19  ALT 14  ALKPHOS 119  BILITOT 0.3  PROT 5.7*  ALBUMIN 2.1*   No results for input(s): LIPASE, AMYLASE in the last 168 hours. No results for input(s): AMMONIA in the last 168 hours.  CBC: Recent Labs  Lab 06/22/19 0438 06/25/19 0604  WBC 10.4 7.1  HGB 9.6* 9.9*  HCT 32.0* 32.8*  MCV 91.7 91.9  PLT 311 268    Cardiac Enzymes: No results for input(s): CKTOTAL, CKMB, CKMBINDEX, TROPONINI in the last 168 hours.  BNP (last 3 results) No results for input(s): BNP in the last 8760 hours.  ProBNP (last 3 results) No  results for input(s): PROBNP in the last 8760 hours.  Radiological Exams: No results found.  Assessment/Plan Active Problems:   Acute on chronic respiratory failure with hypoxia (HCC)   Ventilator associated pneumonia (HCC)   COPD, severe (HCC)   Left acute arterial ischemic stroke, MCA (middle cerebral artery) (HCC)   Cardiac arrest (HCC)   1. Acute on chronic respiratory failure hypoxia we will continue with T collar as tolerated.  Patient is also tolerating the PMV.    Continue secretion management and pulmonary toilet. 2. Ventilator associated pneumonia treated we will continue to follow 3. Severe COPD at baseline medical management 4. Left-sided stroke therapy as tolerated 5. Cardiac arrest rhythm has been stable   I have personally seen and evaluated the patient, evaluated laboratory and imaging results, formulated the assessment and plan and placed orders. The Patient requires high complexity decision making with multiple systems involvement.  Rounds were done with the Respiratory Therapy Director and Staff therapists and discussed with nursing staff also.  [name], MD [facility] Pulmonary Critical Care Medicine Sleep Medicine

## 2019-06-28 DIAGNOSIS — I469 Cardiac arrest, cause unspecified: Secondary | ICD-10-CM | POA: Diagnosis not present

## 2019-06-28 DIAGNOSIS — J449 Chronic obstructive pulmonary disease, unspecified: Secondary | ICD-10-CM | POA: Diagnosis not present

## 2019-06-28 DIAGNOSIS — J9621 Acute and chronic respiratory failure with hypoxia: Secondary | ICD-10-CM | POA: Diagnosis not present

## 2019-06-28 DIAGNOSIS — I63512 Cerebral infarction due to unspecified occlusion or stenosis of left middle cerebral artery: Secondary | ICD-10-CM | POA: Diagnosis not present

## 2019-06-28 NOTE — Progress Notes (Signed)
Pulmonary Critical Care Medicine [facility] [place]   PULMONARY CRITICAL CARE SERVICE  PROGRESS NOTE  Date of Service: 06/28/2019  Garrett Zimmerman  [id number]  DOB: 04/16/1945   DOA: 05/19/2019  Referring Physician: Carron Curie, MD  HPI: Garrett Zimmerman is a 74 y.o. male seen for follow up of Acute on Chronic Respiratory Failure. Patient at this time is on T collar has been on 28% FiO2 using the PMV also  Medications: Reviewed on Rounds  Physical Exam:  Vitals: Temperature 98.6 pulse 102 respiratory 27 blood pressure is 115/88 saturations 98%  Ventilator Settings on T collar with an FiO2 of 28%  . General: Comfortable at this time . Eyes: Grossly normal lids, irises & conjunctiva . ENT: grossly tongue is normal . Neck: no obvious mass . Cardiovascular: S1 S2 normal no gallop . Respiratory: No rhonchi coarse breath sounds . Abdomen: soft . Skin: no rash seen on limited exam . Musculoskeletal: not rigid . Psychiatric:unable to assess . Neurologic: no seizure no involuntary movements         Lab Data:   Basic Metabolic Panel: Recent Labs  Lab 06/22/19 0438 06/25/19 0604  NA 145 145  K 4.0 3.9  CL 104 106  CO2 32 30  GLUCOSE 153* 164*  BUN 27* 29*  CREATININE 0.76 0.74  CALCIUM 9.9 9.9    ABG: No results for input(s): PHART, PCO2ART, PO2ART, HCO3, O2SAT in the last 168 hours.  Liver Function Tests: Recent Labs  Lab 06/22/19 0438  AST 19  ALT 14  ALKPHOS 119  BILITOT 0.3  PROT 5.7*  ALBUMIN 2.1*   No results for input(s): LIPASE, AMYLASE in the last 168 hours. No results for input(s): AMMONIA in the last 168 hours.  CBC: Recent Labs  Lab 06/22/19 0438 06/25/19 0604  WBC 10.4 7.1  HGB 9.6* 9.9*  HCT 32.0* 32.8*  MCV 91.7 91.9  PLT 311 268    Cardiac Enzymes: No results for input(s): CKTOTAL, CKMB, CKMBINDEX, TROPONINI in the last 168 hours.  BNP (last 3 results) No results for input(s): BNP in the last 8760 hours.  ProBNP  (last 3 results) No results for input(s): PROBNP in the last 8760 hours.  Radiological Exams: No results found.  Assessment/Plan Active Problems:   Acute on chronic respiratory failure with hypoxia (HCC)   Ventilator associated pneumonia (HCC)   COPD, severe (HCC)   Left acute arterial ischemic stroke, MCA (middle cerebral artery) (HCC)   Cardiac arrest (HCC)   1. Acute on chronic respiratory failure with hypoxia plan is to continue with T collar patient currently is on 28% FiO2. Patient has also been tolerating the PMV. 2. Ventilator associated motor treated resolved 3. COPD severe medical management 4. Left sided stroke physical therapy as tolerated 5. Cardiac arrest rhythm is stable we will continue to monitor   I have personally seen and evaluated the patient, evaluated laboratory and imaging results, formulated the assessment and plan and placed orders. The Patient requires high complexity decision making with multiple systems involvement.  Rounds were done with the Respiratory Therapy Director and Staff therapists and discussed with nursing staff also.  [name], MD [facility] Pulmonary Critical Care Medicine Sleep Medicine

## 2019-06-29 DIAGNOSIS — I469 Cardiac arrest, cause unspecified: Secondary | ICD-10-CM | POA: Diagnosis not present

## 2019-06-29 DIAGNOSIS — J9621 Acute and chronic respiratory failure with hypoxia: Secondary | ICD-10-CM | POA: Diagnosis not present

## 2019-06-29 DIAGNOSIS — J449 Chronic obstructive pulmonary disease, unspecified: Secondary | ICD-10-CM | POA: Diagnosis not present

## 2019-06-29 DIAGNOSIS — I63512 Cerebral infarction due to unspecified occlusion or stenosis of left middle cerebral artery: Secondary | ICD-10-CM | POA: Diagnosis not present

## 2019-06-29 LAB — BASIC METABOLIC PANEL
Anion gap: 8 (ref 5–15)
BUN: 22 mg/dL (ref 8–23)
CO2: 29 mmol/L (ref 22–32)
Calcium: 9.7 mg/dL (ref 8.9–10.3)
Chloride: 99 mmol/L (ref 98–111)
Creatinine, Ser: 0.68 mg/dL (ref 0.61–1.24)
GFR calc Af Amer: >60 mL/min (ref >60)
GFR calc non Af Amer: >60 mL/min (ref >60)
Glucose, Bld: 128 mg/dL — ABNORMAL HIGH (ref 70–99)
Potassium: 4.3 mmol/L (ref 3.5–5.1)
Sodium: 136 mmol/L (ref 135–145)

## 2019-06-29 LAB — SARS CORONAVIRUS 2 (TAT 6-24 HRS): SARS Coronavirus 2: NEGATIVE

## 2019-06-29 NOTE — Progress Notes (Signed)
Pulmonary Critical Care Medicine [facility] [place]   PULMONARY CRITICAL CARE SERVICE  PROGRESS NOTE  Date of Service: 06/29/2019  Garrett Zimmerman  [id number]  DOB: December 21, 1945   DOA: 05/19/2019  Referring Physician: Carron Curie, MD  HPI: Garrett Zimmerman is a 73 y.o. male seen for follow up of Acute on Chronic Respiratory Failure.  Patient at this time is on T collar has been on 28% FiO2 also using the PMV.  Secretions are reportedly moderate  Medications: Reviewed on Rounds  Physical Exam:  Vitals: Temperature is 96.8 pulse 93 respiratory rate 26 blood pressure is 146/81 saturations 96%  Ventilator Settings on T collar FiO2 of 28% with PMV  . General: Comfortable at this time . Eyes: Grossly normal lids, irises & conjunctiva . ENT: grossly tongue is normal . Neck: no obvious mass . Cardiovascular: S1 S2 normal no gallop . Respiratory: No rhonchi coarse breath sounds are noted at this time . Abdomen: soft . Skin: no rash seen on limited exam . Musculoskeletal: not rigid . Psychiatric:unable to assess . Neurologic: no seizure no involuntary movements         Lab Data:   Basic Metabolic Panel: Recent Labs  Lab 06/25/19 0604 06/29/19 0555  NA 145 136  K 3.9 4.3  CL 106 99  CO2 30 29  GLUCOSE 164* 128*  BUN 29* 22  CREATININE 0.74 0.68  CALCIUM 9.9 9.7    ABG: No results for input(s): PHART, PCO2ART, PO2ART, HCO3, O2SAT in the last 168 hours.  Liver Function Tests: No results for input(s): AST, ALT, ALKPHOS, BILITOT, PROT, ALBUMIN in the last 168 hours. No results for input(s): LIPASE, AMYLASE in the last 168 hours. No results for input(s): AMMONIA in the last 168 hours.  CBC: Recent Labs  Lab 06/25/19 0604  WBC 7.1  HGB 9.9*  HCT 32.8*  MCV 91.9  PLT 268    Cardiac Enzymes: No results for input(s): CKTOTAL, CKMB, CKMBINDEX, TROPONINI in the last 168 hours.  BNP (last 3 results) No results for input(s): BNP in the last 8760  hours.  ProBNP (last 3 results) No results for input(s): PROBNP in the last 8760 hours.  Radiological Exams: No results found.  Assessment/Plan Active Problems:   Acute on chronic respiratory failure with hypoxia (HCC)   Ventilator associated pneumonia (HCC)   COPD, severe (HCC)   Left acute arterial ischemic stroke, MCA (middle cerebral artery) (HCC)   Cardiac arrest (HCC)   1. Acute on chronic respiratory failure with hypoxia plan is going to be to continue with the T collar patient tolerating it well right now right now patient is on 28% FiO2 with the PMV. 2. Ventilator associated pneumonia clinically is improving we will continue to follow 3. Severe COPD at baseline 4. Left-sided acute stroke no change 5. Cardiac arrest rhythm is stable we will continue to monitor.   I have personally seen and evaluated the patient, evaluated laboratory and imaging results, formulated the assessment and plan and placed orders. The Patient requires high complexity decision making with multiple systems involvement.  Rounds were done with the Respiratory Therapy Director and Staff therapists and discussed with nursing staff also.  [name], MD [facility] Pulmonary Critical Care Medicine Sleep Medicine

## 2019-06-30 DIAGNOSIS — J449 Chronic obstructive pulmonary disease, unspecified: Secondary | ICD-10-CM | POA: Diagnosis not present

## 2019-06-30 DIAGNOSIS — J9621 Acute and chronic respiratory failure with hypoxia: Secondary | ICD-10-CM | POA: Diagnosis not present

## 2019-06-30 DIAGNOSIS — I63512 Cerebral infarction due to unspecified occlusion or stenosis of left middle cerebral artery: Secondary | ICD-10-CM | POA: Diagnosis not present

## 2019-06-30 DIAGNOSIS — I469 Cardiac arrest, cause unspecified: Secondary | ICD-10-CM | POA: Diagnosis not present

## 2019-06-30 LAB — VANCOMYCIN, TROUGH: Vancomycin Tr: 28 ug/mL (ref 15–20)

## 2019-06-30 NOTE — Progress Notes (Signed)
Pulmonary Critical Care Medicine [facility] [place]   PULMONARY CRITICAL CARE SERVICE  PROGRESS NOTE  Date of Service: 06/30/2019  Garrett Zimmerman  [id number]:[id number]  DOB: Jul 01, 1945   DOA: 05/19/2019  Referring Physician: Carron Curie, MD  HPI: Garrett Zimmerman is a 74 y.o. male seen for follow up of Acute on Chronic Respiratory Failure.  This morning apparently patient self decannulated now is back recannulated on T collar.  Medications: Reviewed on Rounds  Physical Exam:  Vitals: Temperature is 96.7 pulse 84 respiratory rate 22 blood pressure is 117/70 saturations 97%  Ventilator Settings on T collar FiO2 28%  . General: Comfortable at this time . Eyes: Grossly normal lids, irises & conjunctiva . ENT: grossly tongue is normal . Neck: no obvious mass . Cardiovascular: S1 S2 normal no gallop . Respiratory: No rhonchi no rales are noted . Abdomen: soft . Skin: no rash seen on limited exam . Musculoskeletal: not rigid . Psychiatric:unable to assess . Neurologic: no seizure no involuntary movements         Lab Data:   Basic Metabolic Panel: Recent Labs  Lab 06/25/19 0604 06/29/19 0555  NA 145 136  K 3.9 4.3  CL 106 99  CO2 30 29  GLUCOSE 164* 128*  BUN 29* 22  CREATININE 0.74 0.68  CALCIUM 9.9 9.7    ABG: No results for input(s): PHART, PCO2ART, PO2ART, HCO3, O2SAT in the last 168 hours.  Liver Function Tests: No results for input(s): AST, ALT, ALKPHOS, BILITOT, PROT, ALBUMIN in the last 168 hours. No results for input(s): LIPASE, AMYLASE in the last 168 hours. No results for input(s): AMMONIA in the last 168 hours.  CBC: Recent Labs  Lab 06/25/19 0604  WBC 7.1  HGB 9.9*  HCT 32.8*  MCV 91.9  PLT 268    Cardiac Enzymes: No results for input(s): CKTOTAL, CKMB, CKMBINDEX, TROPONINI in the last 168 hours.  BNP (last 3 results) No results for input(s): BNP in the last 8760 hours.  ProBNP (last 3 results) No results for input(s): PROBNP in  the last 8760 hours.  Radiological Exams: No results found.  Assessment/Plan Active Problems:   Acute on chronic respiratory failure with hypoxia (HCC)   Ventilator associated pneumonia (HCC)   COPD, severe (HCC)   Left acute arterial ischemic stroke, MCA (middle cerebral artery) (HCC)   Cardiac arrest (HCC)   1. Acute on chronic respiratory failure hypoxia we will continue T collar as ordered titrate oxygen as tolerated. 2. Ventilator associated pneumonia clinically improving we will continue to follow 3. Severe COPD at baseline we will continue present management 4. Acute stroke no change supportive care 5. Cardiac arrest rhythm is stable   I have personally seen and evaluated the patient, evaluated laboratory and imaging results, formulated the assessment and plan and placed orders. The Patient requires high complexity decision making with multiple systems involvement.  Rounds were done with the Respiratory Therapy Director and Staff therapists and discussed with nursing staff also.  [name], MD [facility] Pulmonary Critical Care Medicine Sleep Medicine

## 2019-07-01 DIAGNOSIS — I63512 Cerebral infarction due to unspecified occlusion or stenosis of left middle cerebral artery: Secondary | ICD-10-CM | POA: Diagnosis not present

## 2019-07-01 DIAGNOSIS — J9621 Acute and chronic respiratory failure with hypoxia: Secondary | ICD-10-CM | POA: Diagnosis not present

## 2019-07-01 DIAGNOSIS — J449 Chronic obstructive pulmonary disease, unspecified: Secondary | ICD-10-CM | POA: Diagnosis not present

## 2019-07-01 DIAGNOSIS — I469 Cardiac arrest, cause unspecified: Secondary | ICD-10-CM | POA: Diagnosis not present

## 2019-07-01 NOTE — Progress Notes (Signed)
Pulmonary Critical Care Medicine [facility] [place]   PULMONARY CRITICAL CARE SERVICE  PROGRESS NOTE  Date of Service: 07/01/2019  Garrett Zimmerman  [id number]  DOB: 05/08/1945   DOA: 05/19/2019  Referring Physician: Carron Curie, MD  HPI: Garrett Zimmerman is a 74 y.o. male seen for follow up of Acute on Chronic Respiratory Failure.  Patient currently is on T collar has been on 20% FiO2 with good saturations.  Is using the PMV  Medications: Reviewed on Rounds  Physical Exam:  Vitals: Temperature 96.8 pulse 105 respiratory 24 blood pressure is 133/78 saturations 94%  Ventilator Settings on T collar FiO2 28%  . General: Comfortable at this time . Eyes: Grossly normal lids, irises & conjunctiva . ENT: grossly tongue is normal . Neck: no obvious mass . Cardiovascular: S1 S2 normal no gallop . Respiratory: No rhonchi coarse breath sounds noted . Abdomen: soft . Skin: no rash seen on limited exam . Musculoskeletal: not rigid . Psychiatric:unable to assess . Neurologic: no seizure no involuntary movements         Lab Data:   Basic Metabolic Panel: Recent Labs  Lab 06/25/19 0604 06/29/19 0555  NA 145 136  K 3.9 4.3  CL 106 99  CO2 30 29  GLUCOSE 164* 128*  BUN 29* 22  CREATININE 0.74 0.68  CALCIUM 9.9 9.7    ABG: No results for input(s): PHART, PCO2ART, PO2ART, HCO3, O2SAT in the last 168 hours.  Liver Function Tests: No results for input(s): AST, ALT, ALKPHOS, BILITOT, PROT, ALBUMIN in the last 168 hours. No results for input(s): LIPASE, AMYLASE in the last 168 hours. No results for input(s): AMMONIA in the last 168 hours.  CBC: Recent Labs  Lab 06/25/19 0604  WBC 7.1  HGB 9.9*  HCT 32.8*  MCV 91.9  PLT 268    Cardiac Enzymes: No results for input(s): CKTOTAL, CKMB, CKMBINDEX, TROPONINI in the last 168 hours.  BNP (last 3 results) No results for input(s): BNP in the last 8760 hours.  ProBNP (last 3 results) No results for input(s):  PROBNP in the last 8760 hours.  Radiological Exams: No results found.  Assessment/Plan Active Problems:   Acute on chronic respiratory failure with hypoxia (HCC)   Ventilator associated pneumonia (HCC)   COPD, severe (HCC)   Left acute arterial ischemic stroke, MCA (middle cerebral artery) (HCC)   Cardiac arrest (HCC)   1. Acute on chronic respiratory failure hypoxia we will continue with T collar trials currently on 28% FiO2 using PMV as ordered ventilator associated pneumonia no change we will continue with supportive care 2. Severe COPD medical management 3. Left-sided stroke patient is at baseline therapy as tolerated 4. Cardiac arrest rhythm is stable   I have personally seen and evaluated the patient, evaluated laboratory and imaging results, formulated the assessment and plan and placed orders. The Patient requires high complexity decision making with multiple systems involvement.  Rounds were done with the Respiratory Therapy Director and Staff therapists and discussed with nursing staff also.  [name], MD [facility] Pulmonary Critical Care Medicine Sleep Medicine

## 2019-07-02 DIAGNOSIS — J9621 Acute and chronic respiratory failure with hypoxia: Secondary | ICD-10-CM | POA: Diagnosis not present

## 2019-07-02 DIAGNOSIS — I63512 Cerebral infarction due to unspecified occlusion or stenosis of left middle cerebral artery: Secondary | ICD-10-CM | POA: Diagnosis not present

## 2019-07-02 DIAGNOSIS — J449 Chronic obstructive pulmonary disease, unspecified: Secondary | ICD-10-CM | POA: Diagnosis not present

## 2019-07-02 DIAGNOSIS — I469 Cardiac arrest, cause unspecified: Secondary | ICD-10-CM | POA: Diagnosis not present

## 2019-07-02 NOTE — Progress Notes (Signed)
Pulmonary Critical Care Medicine [facility] [place]   PULMONARY CRITICAL CARE SERVICE  PROGRESS NOTE  Date of Service: 07/02/2019  Garrett Zimmerman  [id number]  DOB: 07/02/1945   DOA: 05/19/2019  Referring Physician: Carron Curie, MD  HPI: Garrett Zimmerman is a 74 y.o. male seen for follow up of Acute on Chronic Respiratory Failure.  Patient is off the ventilator on T collar currently on 28% FiO2 with PMV  Medications: Reviewed on Rounds  Physical Exam:  Vitals: Temperature is 97.3 pulse 88 respiratory 18 blood pressure is 107/67 saturations 99%  Ventilator Settings on T collar FiO2 28%  . General: Comfortable at this time . Eyes: Grossly normal lids, irises & conjunctiva . ENT: grossly tongue is normal . Neck: no obvious mass . Cardiovascular: S1 S2 normal no gallop . Respiratory: No rhonchi no rales are noted at this time . Abdomen: soft . Skin: no rash seen on limited exam . Musculoskeletal: not rigid . Psychiatric:unable to assess . Neurologic: no seizure no involuntary movements         Lab Data:   Basic Metabolic Panel: Recent Labs  Lab 06/29/19 0555  NA 136  K 4.3  CL 99  CO2 29  GLUCOSE 128*  BUN 22  CREATININE 0.68  CALCIUM 9.7    ABG: No results for input(s): PHART, PCO2ART, PO2ART, HCO3, O2SAT in the last 168 hours.  Liver Function Tests: No results for input(s): AST, ALT, ALKPHOS, BILITOT, PROT, ALBUMIN in the last 168 hours. No results for input(s): LIPASE, AMYLASE in the last 168 hours. No results for input(s): AMMONIA in the last 168 hours.  CBC: No results for input(s): WBC, NEUTROABS, HGB, HCT, MCV, PLT in the last 168 hours.  Cardiac Enzymes: No results for input(s): CKTOTAL, CKMB, CKMBINDEX, TROPONINI in the last 168 hours.  BNP (last 3 results) No results for input(s): BNP in the last 8760 hours.  ProBNP (last 3 results) No results for input(s): PROBNP in the last 8760 hours.  Radiological Exams: No results  found.  Assessment/Plan Active Problems:   Acute on chronic respiratory failure with hypoxia (HCC)   Ventilator associated pneumonia (HCC)   COPD, severe (HCC)   Left acute arterial ischemic stroke, MCA (middle cerebral artery) (HCC)   Cardiac arrest (HCC)   1. Acute on chronic respiratory failure hypoxia plan to continue with T collar trials currently on 28% FiO2 using PMV 2. Ventilator associated pneumonia treated we will continue to follow along 3. Severe COPD at baseline medical management 4. Left-sided stroke no change 5. Cardiac arrest rhythm has been stable continue to follow   I have personally seen and evaluated the patient, evaluated laboratory and imaging results, formulated the assessment and plan and placed orders. The Patient requires high complexity decision making with multiple systems involvement.  Rounds were done with the Respiratory Therapy Director and Staff therapists and discussed with nursing staff also.  [name], MD [facility] Pulmonary Critical Care Medicine Sleep Medicine

## 2019-07-03 DIAGNOSIS — I469 Cardiac arrest, cause unspecified: Secondary | ICD-10-CM | POA: Diagnosis not present

## 2019-07-03 DIAGNOSIS — J9621 Acute and chronic respiratory failure with hypoxia: Secondary | ICD-10-CM | POA: Diagnosis not present

## 2019-07-03 DIAGNOSIS — I63512 Cerebral infarction due to unspecified occlusion or stenosis of left middle cerebral artery: Secondary | ICD-10-CM | POA: Diagnosis not present

## 2019-07-03 DIAGNOSIS — J449 Chronic obstructive pulmonary disease, unspecified: Secondary | ICD-10-CM | POA: Diagnosis not present

## 2019-07-03 NOTE — Progress Notes (Signed)
Pulmonary Critical Care Medicine [facility] [place]   PULMONARY CRITICAL CARE SERVICE  PROGRESS NOTE  Date of Service: 07/03/2019  Garrett Zimmerman  [id number]  DOB: May 21, 1945   DOA: 05/19/2019  Referring Physician: Carron Curie, MD  HPI: Garrett Zimmerman is a 74 y.o. male seen for follow up of Acute on Chronic Respiratory Failure.  Patient currently is on T collar on 28% FiO2 secretions are fair to moderate  Medications: Reviewed on Rounds  Physical Exam:  Vitals: Temperature 97.4 pulse 90 respiratory rate 20 blood pressure is 133/68 saturations 98%  Ventilator Settings off the ventilator on T collar currently on 20% FiO2  . General: Comfortable at this time . Eyes: Grossly normal lids, irises & conjunctiva . ENT: grossly tongue is normal . Neck: no obvious mass . Cardiovascular: S1 S2 normal no gallop . Respiratory: No rhonchi no rales noted at this time . Abdomen: soft . Skin: no rash seen on limited exam . Musculoskeletal: not rigid . Psychiatric:unable to assess . Neurologic: no seizure no involuntary movements         Lab Data:   Basic Metabolic Panel: Recent Labs  Lab 06/29/19 0555  NA 136  K 4.3  CL 99  CO2 29  GLUCOSE 128*  BUN 22  CREATININE 0.68  CALCIUM 9.7    ABG: No results for input(s): PHART, PCO2ART, PO2ART, HCO3, O2SAT in the last 168 hours.  Liver Function Tests: No results for input(s): AST, ALT, ALKPHOS, BILITOT, PROT, ALBUMIN in the last 168 hours. No results for input(s): LIPASE, AMYLASE in the last 168 hours. No results for input(s): AMMONIA in the last 168 hours.  CBC: No results for input(s): WBC, NEUTROABS, HGB, HCT, MCV, PLT in the last 168 hours.  Cardiac Enzymes: No results for input(s): CKTOTAL, CKMB, CKMBINDEX, TROPONINI in the last 168 hours.  BNP (last 3 results) No results for input(s): BNP in the last 8760 hours.  ProBNP (last 3 results) No results for input(s): PROBNP in the last 8760  hours.  Radiological Exams: No results found.  Assessment/Plan Active Problems:   Acute on chronic respiratory failure with hypoxia (HCC)   Ventilator associated pneumonia (HCC)   COPD, severe (HCC)   Left acute arterial ischemic stroke, MCA (middle cerebral artery) (HCC)   Cardiac arrest (HCC)   1. Acute on chronic respiratory failure hypoxia we will continue with T collar trials patient is at baseline 28% FiO2 continue aggressive pulmonary toilet. 2. Severe COPD supportive care medical management 3. Ventilator associated pneumonia treated clinically improved 4. Left-sided stroke no change supportive care 5. Cardiac arrest rhythm has been stable   I have personally seen and evaluated the patient, evaluated laboratory and imaging results, formulated the assessment and plan and placed orders. The Patient requires high complexity decision making with multiple systems involvement.  Rounds were done with the Respiratory Therapy Director and Staff therapists and discussed with nursing staff also.  [name], MD [facility] Pulmonary Critical Care Medicine Sleep Medicine

## 2019-07-04 DIAGNOSIS — J9621 Acute and chronic respiratory failure with hypoxia: Secondary | ICD-10-CM | POA: Diagnosis not present

## 2019-07-04 DIAGNOSIS — I469 Cardiac arrest, cause unspecified: Secondary | ICD-10-CM | POA: Diagnosis not present

## 2019-07-04 DIAGNOSIS — I63512 Cerebral infarction due to unspecified occlusion or stenosis of left middle cerebral artery: Secondary | ICD-10-CM | POA: Diagnosis not present

## 2019-07-04 DIAGNOSIS — J449 Chronic obstructive pulmonary disease, unspecified: Secondary | ICD-10-CM | POA: Diagnosis not present

## 2019-07-04 NOTE — Progress Notes (Addendum)
Pulmonary Critical Care Medicine [facility] [place]   PULMONARY CRITICAL CARE SERVICE  PROGRESS NOTE  Date of Service: 07/04/2019  [name]  [id number]  DOB: [birth year]   DOA: 05/19/2019  Referring Physician: Carron Curie, MD  HPI: Garrett Zimmerman is a 74 y.o. male seen for follow up of Acute on Chronic Respiratory Failure.  Patient is currently on 20% aerosol trach collar satting well no fever or distress.  Medications: Reviewed on Rounds  Physical Exam:  Vitals: Pulse 96 respirations 27 BP 133/70 O2 sat 98% temp 97.6  Ventilator Settings 20% ATC  . General: Comfortable at this time . Eyes: Grossly normal lids, irises & conjunctiva . ENT: grossly tongue is normal . Neck: no obvious mass . Cardiovascular: S1 S2 normal no gallop . Respiratory: No rales or rhonchi noted . Abdomen: soft . Skin: no rash seen on limited exam . Musculoskeletal: not rigid . Psychiatric:unable to assess . Neurologic: no seizure no involuntary movements         Lab Data:   Basic Metabolic Panel: Recent Labs  Lab 06/29/19 0555  NA 136  K 4.3  CL 99  CO2 29  GLUCOSE 128*  BUN 22  CREATININE 0.68  CALCIUM 9.7    ABG: No results for input(s): PHART, PCO2ART, PO2ART, HCO3, O2SAT in the last 168 hours.  Liver Function Tests: No results for input(s): AST, ALT, ALKPHOS, BILITOT, PROT, ALBUMIN in the last 168 hours. No results for input(s): LIPASE, AMYLASE in the last 168 hours. No results for input(s): AMMONIA in the last 168 hours.  CBC: No results for input(s): WBC, NEUTROABS, HGB, HCT, MCV, PLT in the last 168 hours.  Cardiac Enzymes: No results for input(s): CKTOTAL, CKMB, CKMBINDEX, TROPONINI in the last 168 hours.  BNP (last 3 results) No results for input(s): BNP in the last 8760 hours.  ProBNP (last 3 results) No results for input(s): PROBNP in the last 8760 hours.  Radiological Exams: No results found.  Assessment/Plan Active Problems:   Acute  on chronic respiratory failure with hypoxia (HCC)   Ventilator associated pneumonia (HCC)   COPD, severe (HCC)   Left acute arterial ischemic stroke, MCA (middle cerebral artery) (HCC)   Cardiac arrest (HCC)   1. Acute on chronic respiratory failure hypoxia patient remains on aerosol trach collar 28% FiO2 satting well 99% today.  We will continue aggressive pulmonary toilet and supportive measures. 2. Severe COPD supportive care medical management 3. Ventilator associated pneumonia treated clinically improved 4. Left-sided stroke no change supportive care 5. Cardiac arrest rhythm has been stable   I have personally seen and evaluated the patient, evaluated laboratory and imaging results, formulated the assessment and plan and placed orders. The Patient requires high complexity decision making with multiple systems involvement.  Rounds were done with the Respiratory Therapy Director and Staff therapists and discussed with nursing staff also.  [name], MD [facility] Pulmonary Critical Care Medicine Sleep Medicine

## 2019-07-05 DIAGNOSIS — I63512 Cerebral infarction due to unspecified occlusion or stenosis of left middle cerebral artery: Secondary | ICD-10-CM | POA: Diagnosis not present

## 2019-07-05 DIAGNOSIS — J449 Chronic obstructive pulmonary disease, unspecified: Secondary | ICD-10-CM | POA: Diagnosis not present

## 2019-07-05 DIAGNOSIS — I469 Cardiac arrest, cause unspecified: Secondary | ICD-10-CM | POA: Diagnosis not present

## 2019-07-05 DIAGNOSIS — J9621 Acute and chronic respiratory failure with hypoxia: Secondary | ICD-10-CM | POA: Diagnosis not present

## 2019-07-05 NOTE — Progress Notes (Signed)
Pulmonary Critical Care Medicine [facility] [place]   PULMONARY CRITICAL CARE SERVICE  PROGRESS NOTE  Date of Service: 07/05/2019  Garrett Zimmerman  [id number]  DOB: 07/28/1945   DOA: 05/19/2019  Referring Physician: Carron Curie, MD  HPI: Garrett Zimmerman is a 74 y.o. male seen for follow up of Acute on Chronic Respiratory Failure.  Patient is off the ventilator right now on T collar copious secretions are still noted.  Medications: Reviewed on Rounds  Physical Exam:  Vitals: Temperature 97.0 pulse 106 respiratory rate 16 blood pressure is 111/67 saturations 93%  Ventilator Settings off ventilator on T collar FiO2 28%  . General: Comfortable at this time . Eyes: Grossly normal lids, irises & conjunctiva . ENT: grossly tongue is normal . Neck: no obvious mass . Cardiovascular: S1 S2 normal no gallop . Respiratory: No rhonchi no rales are noted at this time . Abdomen: soft . Skin: no rash seen on limited exam . Musculoskeletal: not rigid . Psychiatric:unable to assess . Neurologic: no seizure no involuntary movements         Lab Data:   Basic Metabolic Panel: Recent Labs  Lab 06/29/19 0555  NA 136  K 4.3  CL 99  CO2 29  GLUCOSE 128*  BUN 22  CREATININE 0.68  CALCIUM 9.7    ABG: No results for input(s): PHART, PCO2ART, PO2ART, HCO3, O2SAT in the last 168 hours.  Liver Function Tests: No results for input(s): AST, ALT, ALKPHOS, BILITOT, PROT, ALBUMIN in the last 168 hours. No results for input(s): LIPASE, AMYLASE in the last 168 hours. No results for input(s): AMMONIA in the last 168 hours.  CBC: No results for input(s): WBC, NEUTROABS, HGB, HCT, MCV, PLT in the last 168 hours.  Cardiac Enzymes: No results for input(s): CKTOTAL, CKMB, CKMBINDEX, TROPONINI in the last 168 hours.  BNP (last 3 results) No results for input(s): BNP in the last 8760 hours.  ProBNP (last 3 results) No results for input(s): PROBNP in the last 8760  hours.  Radiological Exams: No results found.  Assessment/Plan Active Problems:   Acute on chronic respiratory failure with hypoxia (HCC)   Ventilator associated pneumonia (HCC)   COPD, severe (HCC)   Left acute arterial ischemic stroke, MCA (middle cerebral artery) (HCC)   Cardiac arrest (HCC)   1. Acute on chronic respiratory failure hypoxia we will continue with T collar trials titrate oxygen continue pulmonary toilet.  Secretions are still of concern 2. Ventilator associated pneumonia treated clinically improved 3. Severe COPD at baseline we will continue to follow 4. Left-sided acute stroke no change supportive care 5. Cardiac arrest rhythm has been stable continue to monitor   I have personally seen and evaluated the patient, evaluated laboratory and imaging results, formulated the assessment and plan and placed orders. The Patient requires high complexity decision making with multiple systems involvement.  Rounds were done with the Respiratory Therapy Director and Staff therapists and discussed with nursing staff also.  [name], MD [facility] Pulmonary Critical Care Medicine Sleep Medicine

## 2019-07-06 DIAGNOSIS — I469 Cardiac arrest, cause unspecified: Secondary | ICD-10-CM | POA: Diagnosis not present

## 2019-07-06 DIAGNOSIS — J449 Chronic obstructive pulmonary disease, unspecified: Secondary | ICD-10-CM | POA: Diagnosis not present

## 2019-07-06 DIAGNOSIS — I63512 Cerebral infarction due to unspecified occlusion or stenosis of left middle cerebral artery: Secondary | ICD-10-CM | POA: Diagnosis not present

## 2019-07-06 DIAGNOSIS — J9621 Acute and chronic respiratory failure with hypoxia: Secondary | ICD-10-CM | POA: Diagnosis not present

## 2019-07-06 LAB — BASIC METABOLIC PANEL
Anion gap: 6 (ref 5–15)
BUN: 24 mg/dL — ABNORMAL HIGH (ref 8–23)
CO2: 33 mmol/L — ABNORMAL HIGH (ref 22–32)
Calcium: 10.1 mg/dL (ref 8.9–10.3)
Chloride: 100 mmol/L (ref 98–111)
Creatinine, Ser: 0.72 mg/dL (ref 0.61–1.24)
GFR calc Af Amer: >60 mL/min (ref >60)
GFR calc non Af Amer: >60 mL/min (ref >60)
Glucose, Bld: 144 mg/dL — ABNORMAL HIGH (ref 70–99)
Potassium: 3.9 mmol/L (ref 3.5–5.1)
Sodium: 139 mmol/L (ref 135–145)

## 2019-07-06 LAB — CBC
HCT: 32.5 % — ABNORMAL LOW (ref 39.0–52.0)
Hemoglobin: 9.9 g/dL — ABNORMAL LOW (ref 13.0–17.0)
MCH: 27.7 pg (ref 26.0–34.0)
MCHC: 30.5 g/dL (ref 30.0–36.0)
MCV: 90.8 fL (ref 80.0–100.0)
Platelets: 230 10*3/uL (ref 150–400)
RBC: 3.58 MIL/uL — ABNORMAL LOW (ref 4.22–5.81)
RDW: 19 % — ABNORMAL HIGH (ref 11.5–15.5)
WBC: 7.9 10*3/uL (ref 4.0–10.5)
nRBC: 0 % (ref 0.0–0.2)

## 2019-07-06 NOTE — Progress Notes (Signed)
Pulmonary Critical Care Medicine [facility] [place]   PULMONARY CRITICAL CARE SERVICE  PROGRESS NOTE  Date of Service: 07/06/2019  Garrett Zimmerman  [id number]  DOB: [birth year]   DOA: 05/19/2019  Referring Physician: Carron Curie, MD  HPI: Garrett Zimmerman is a 74 y.o. male seen for follow up of Acute on Chronic Respiratory Failure.  Patient currently is on T collar has been on 28% FiO2 with good saturations are noted at this time  Medications: Reviewed on Rounds  Physical Exam:  Vitals: Temperature is 97.3 pulse 74 respiratory rate 27 blood pressure is 150/73 saturations 94%  Ventilator Settings on T collar with an FiO2 of 28% with PMV  . General: Comfortable at this time . Eyes: Grossly normal lids, irises & conjunctiva . ENT: grossly tongue is normal . Neck: no obvious mass . Cardiovascular: S1 S2 normal no gallop . Respiratory: No rhonchi coarse breath sounds are noted . Abdomen: soft . Skin: no rash seen on limited exam . Musculoskeletal: not rigid . Psychiatric:unable to assess . Neurologic: no seizure no involuntary movements         Lab Data:   Basic Metabolic Panel: Recent Labs  Lab 07/06/19 0627  NA 139  K 3.9  CL 100  CO2 33*  GLUCOSE 144*  BUN 24*  CREATININE 0.72  CALCIUM 10.1    ABG: No results for input(s): PHART, PCO2ART, PO2ART, HCO3, O2SAT in the last 168 hours.  Liver Function Tests: No results for input(s): AST, ALT, ALKPHOS, BILITOT, PROT, ALBUMIN in the last 168 hours. No results for input(s): LIPASE, AMYLASE in the last 168 hours. No results for input(s): AMMONIA in the last 168 hours.  CBC: Recent Labs  Lab 07/06/19 0627  WBC 7.9  HGB 9.9*  HCT 32.5*  MCV 90.8  PLT 230    Cardiac Enzymes: No results for input(s): CKTOTAL, CKMB, CKMBINDEX, TROPONINI in the last 168 hours.  BNP (last 3 results) No results for input(s): BNP in the last 8760 hours.  ProBNP (last 3 results) No results for input(s): PROBNP in  the last 8760 hours.  Radiological Exams: No results found.  Assessment/Plan Active Problems:   Acute on chronic respiratory failure with hypoxia (HCC)   Ventilator associated pneumonia (HCC)   COPD, severe (HCC)   Left acute arterial ischemic stroke, MCA (middle cerebral artery) (HCC)   Cardiac arrest (HCC)   1. Acute on chronic respiratory failure with hypoxia patient is on T collar currently on 28% FiO2 with the PMV in place. 2. Ventilator associated pneumonia treated we will continue with supportive care 3. Severe COPD at baseline we will continue to follow 4. Left-sided stroke no change 5. Cardiac arrest rhythm is stable we will continue to monitor closely   I have personally seen and evaluated the patient, evaluated laboratory and imaging results, formulated the assessment and plan and placed orders. The Patient requires high complexity decision making with multiple systems involvement.  Rounds were done with the Respiratory Therapy Director and Staff therapists and discussed with nursing staff also.  [name], MD [facility] Pulmonary Critical Care Medicine Sleep Medicine

## 2019-07-07 DIAGNOSIS — J9621 Acute and chronic respiratory failure with hypoxia: Secondary | ICD-10-CM | POA: Diagnosis not present

## 2019-07-07 DIAGNOSIS — I469 Cardiac arrest, cause unspecified: Secondary | ICD-10-CM | POA: Diagnosis not present

## 2019-07-07 DIAGNOSIS — I63512 Cerebral infarction due to unspecified occlusion or stenosis of left middle cerebral artery: Secondary | ICD-10-CM | POA: Diagnosis not present

## 2019-07-07 DIAGNOSIS — J449 Chronic obstructive pulmonary disease, unspecified: Secondary | ICD-10-CM | POA: Diagnosis not present

## 2019-07-07 NOTE — Progress Notes (Signed)
Pulmonary Critical Care Medicine [facility] [place]   PULMONARY CRITICAL CARE SERVICE  PROGRESS NOTE  Date of Service: 07/07/2019  Garrett Zimmerman  [id number]  DOB: 06/17/1945   DOA: 05/19/2019  Referring Physician: Carron Curie, MD  HPI: Garrett Zimmerman is a 74 y.o. male seen for follow up of Acute on Chronic Respiratory Failure.  Patient is comfortable right now without distress off the ventilator has been on T collar 28% FiO2 using PMV  Medications: Reviewed on Rounds  Physical Exam:  Vitals: Temperature is 98.2 pulse 87 respiratory rate 20 blood pressure is 127/65 saturations 98%  Ventilator Settings on T collar with an FiO2 28% using PMV  . General: Comfortable at this time . Eyes: Grossly normal lids, irises & conjunctiva . ENT: grossly tongue is normal . Neck: no obvious mass . Cardiovascular: S1 S2 normal no gallop . Respiratory: No rhonchi no rales at this time . Abdomen: soft . Skin: no rash seen on limited exam . Musculoskeletal: not rigid . Psychiatric:unable to assess . Neurologic: no seizure no involuntary movements         Lab Data:   Basic Metabolic Panel: Recent Labs  Lab 07/06/19 0627  NA 139  K 3.9  CL 100  CO2 33*  GLUCOSE 144*  BUN 24*  CREATININE 0.72  CALCIUM 10.1    ABG: No results for input(s): PHART, PCO2ART, PO2ART, HCO3, O2SAT in the last 168 hours.  Liver Function Tests: No results for input(s): AST, ALT, ALKPHOS, BILITOT, PROT, ALBUMIN in the last 168 hours. No results for input(s): LIPASE, AMYLASE in the last 168 hours. No results for input(s): AMMONIA in the last 168 hours.  CBC: Recent Labs  Lab 07/06/19 0627  WBC 7.9  HGB 9.9*  HCT 32.5*  MCV 90.8  PLT 230    Cardiac Enzymes: No results for input(s): CKTOTAL, CKMB, CKMBINDEX, TROPONINI in the last 168 hours.  BNP (last 3 results) No results for input(s): BNP in the last 8760 hours.  ProBNP (last 3 results) No results for input(s): PROBNP in  the last 8760 hours.  Radiological Exams: No results found.  Assessment/Plan Active Problems:   Acute on chronic respiratory failure with hypoxia (HCC)   Ventilator associated pneumonia (HCC)   COPD, severe (HCC)   Left acute arterial ischemic stroke, MCA (middle cerebral artery) (HCC)   Cardiac arrest (HCC)   1. Acute on chronic respiratory failure with hypoxia patient appears to be doing well with T collar plan is to continue to use the PMV also 2. Ventilator associated pneumonia treated clinically improved 3. Severe COPD at baseline nebulizers as needed 4. Left-sided acute stroke physical therapy will continue with supportive care 5. Cardiac arrest rhythm is stable   I have personally seen and evaluated the patient, evaluated laboratory and imaging results, formulated the assessment and plan and placed orders. The Patient requires high complexity decision making with multiple systems involvement.  Rounds were done with the Respiratory Therapy Director and Staff therapists and discussed with nursing staff also.  [name], MD [facility] Pulmonary Critical Care Medicine Sleep Medicine

## 2019-07-08 DIAGNOSIS — J449 Chronic obstructive pulmonary disease, unspecified: Secondary | ICD-10-CM | POA: Diagnosis not present

## 2019-07-08 DIAGNOSIS — I63512 Cerebral infarction due to unspecified occlusion or stenosis of left middle cerebral artery: Secondary | ICD-10-CM | POA: Diagnosis not present

## 2019-07-08 DIAGNOSIS — J9621 Acute and chronic respiratory failure with hypoxia: Secondary | ICD-10-CM | POA: Diagnosis not present

## 2019-07-08 DIAGNOSIS — I469 Cardiac arrest, cause unspecified: Secondary | ICD-10-CM | POA: Diagnosis not present

## 2019-07-08 NOTE — Progress Notes (Signed)
Pulmonary Critical Care Medicine [facility] [place]   PULMONARY CRITICAL CARE SERVICE  PROGRESS NOTE  Date of Service: 07/08/2019  Garrett Zimmerman  [id number]  DOB: [birth year]   DOA: 05/19/2019  Referring Physician: Carron Curie, MD  HPI: Garrett Zimmerman is a 74 y.o. male seen for follow up of Acute on Chronic Respiratory Failure.  Patient currently is on T collar has been on 28% FiO2 using PMV good saturations.  Medications: Reviewed on Rounds  Physical Exam:  Vitals: Temperature is 97.0 pulse 94 respiratory rate 22 blood pressure is 160/74 saturations 99%  Ventilator Settings on T collar FiO2 28% using PMV  . General: Comfortable at this time . Eyes: Grossly normal lids, irises & conjunctiva . ENT: grossly tongue is normal . Neck: no obvious mass . Cardiovascular: S1 S2 normal no gallop . Respiratory: Scattered rhonchi expansion is equal . Abdomen: soft . Skin: no rash seen on limited exam . Musculoskeletal: not rigid . Psychiatric:unable to assess . Neurologic: no seizure no involuntary movements         Lab Data:   Basic Metabolic Panel: Recent Labs  Lab 07/06/19 0627  NA 139  K 3.9  CL 100  CO2 33*  GLUCOSE 144*  BUN 24*  CREATININE 0.72  CALCIUM 10.1    ABG: No results for input(s): PHART, PCO2ART, PO2ART, HCO3, O2SAT in the last 168 hours.  Liver Function Tests: No results for input(s): AST, ALT, ALKPHOS, BILITOT, PROT, ALBUMIN in the last 168 hours. No results for input(s): LIPASE, AMYLASE in the last 168 hours. No results for input(s): AMMONIA in the last 168 hours.  CBC: Recent Labs  Lab 07/06/19 0627  WBC 7.9  HGB 9.9*  HCT 32.5*  MCV 90.8  PLT 230    Cardiac Enzymes: No results for input(s): CKTOTAL, CKMB, CKMBINDEX, TROPONINI in the last 168 hours.  BNP (last 3 results) No results for input(s): BNP in the last 8760 hours.  ProBNP (last 3 results) No results for input(s): PROBNP in the last 8760  hours.  Radiological Exams: No results found.  Assessment/Plan Active Problems:   Acute on chronic respiratory failure with hypoxia (HCC)   Ventilator associated pneumonia (HCC)   COPD, severe (HCC)   Left acute arterial ischemic stroke, MCA (middle cerebral artery) (HCC)   Cardiac arrest (HCC)   1. Acute on chronic respiratory failure with hypoxia we will continue with T collar trials patient is on 28% FiO2 using PMV good saturations are noted. 2. Ventilator associated pneumonia treated clinically does seem to be improving 3. Severe COPD medical management 4. Left-sided stroke no change continue with supportive care. 5. Cardiac arrest rhythm has been stable we will continue to follow   I have personally seen and evaluated the patient, evaluated laboratory and imaging results, formulated the assessment and plan and placed orders. The Patient requires high complexity decision making with multiple systems involvement.  Rounds were done with the Respiratory Therapy Director and Staff therapists and discussed with nursing staff also.  [name], MD [facility] Pulmonary Critical Care Medicine Sleep Medicine

## 2019-07-09 DIAGNOSIS — J449 Chronic obstructive pulmonary disease, unspecified: Secondary | ICD-10-CM | POA: Diagnosis not present

## 2019-07-09 DIAGNOSIS — I63512 Cerebral infarction due to unspecified occlusion or stenosis of left middle cerebral artery: Secondary | ICD-10-CM | POA: Diagnosis not present

## 2019-07-09 DIAGNOSIS — J9621 Acute and chronic respiratory failure with hypoxia: Secondary | ICD-10-CM | POA: Diagnosis not present

## 2019-07-09 DIAGNOSIS — I469 Cardiac arrest, cause unspecified: Secondary | ICD-10-CM | POA: Diagnosis not present

## 2019-07-09 NOTE — Progress Notes (Addendum)
Pulmonary Critical Care Medicine [facility] [place]   PULMONARY CRITICAL CARE SERVICE  PROGRESS NOTE  Date of Service: 07/09/2019  Garrett Zimmerman  [id number]  DOB: 09/08/1945   DOA: 05/19/2019  Referring Physician: Carron Curie, MD  HPI: Garrett Zimmerman is a 74 y.o. male seen for follow up of Acute on Chronic Respiratory Failure.  Patient mains on aerosol trach collar 30% FiO2 using PMV with no difficulty satting well this time no distress.  Medications: Reviewed on Rounds  Physical Exam:  Vitals: Pulse 88 respirations 22 BP 123/72 O2 sat 91% temp 99.0  Ventilator Settings 30% ATC  . General: Comfortable at this time . Eyes: Grossly normal lids, irises & conjunctiva . ENT: grossly tongue is normal . Neck: no obvious mass . Cardiovascular: S1 S2 normal no gallop . Respiratory: No rales or rhonchi noted . Abdomen: soft . Skin: no rash seen on limited exam . Musculoskeletal: not rigid . Psychiatric:unable to assess . Neurologic: no seizure no involuntary movements         Lab Data:   Basic Metabolic Panel: Recent Labs  Lab 07/06/19 0627  NA 139  K 3.9  CL 100  CO2 33*  GLUCOSE 144*  BUN 24*  CREATININE 0.72  CALCIUM 10.1    ABG: No results for input(s): PHART, PCO2ART, PO2ART, HCO3, O2SAT in the last 168 hours.  Liver Function Tests: No results for input(s): AST, ALT, ALKPHOS, BILITOT, PROT, ALBUMIN in the last 168 hours. No results for input(s): LIPASE, AMYLASE in the last 168 hours. No results for input(s): AMMONIA in the last 168 hours.  CBC: Recent Labs  Lab 07/06/19 0627  WBC 7.9  HGB 9.9*  HCT 32.5*  MCV 90.8  PLT 230    Cardiac Enzymes: No results for input(s): CKTOTAL, CKMB, CKMBINDEX, TROPONINI in the last 168 hours.  BNP (last 3 results) No results for input(s): BNP in the last 8760 hours.  ProBNP (last 3 results) No results for input(s): PROBNP in the last 8760 hours.  Radiological Exams: No results  found.  Assessment/Plan Active Problems:   Acute on chronic respiratory failure with hypoxia (HCC)   Ventilator associated pneumonia (HCC)   COPD, severe (HCC)   Left acute arterial ischemic stroke, MCA (middle cerebral artery) (HCC)   Cardiac arrest (HCC)   1. Acute on chronic respiratory failure with hypoxia we will continue with T collar trials patient is on 30% FiO2 using PMV good saturations are noted. 2. Ventilator associated pneumonia treated clinically does seem to be improving 3. Severe COPD medical management 4. Left-sided stroke no change continue with supportive care. 5. Cardiac arrest rhythm has been stable we will continue to follow   I have personally seen and evaluated the patient, evaluated laboratory and imaging results, formulated the assessment and plan and placed orders. The Patient requires high complexity decision making with multiple systems involvement.  Rounds were done with the Respiratory Therapy Director and Staff therapists and discussed with nursing staff also.  [name], MD [facility] Pulmonary Critical Care Medicine Sleep Medicine

## 2019-07-10 DIAGNOSIS — J9621 Acute and chronic respiratory failure with hypoxia: Secondary | ICD-10-CM | POA: Diagnosis not present

## 2019-07-10 DIAGNOSIS — I469 Cardiac arrest, cause unspecified: Secondary | ICD-10-CM | POA: Diagnosis not present

## 2019-07-10 DIAGNOSIS — I63512 Cerebral infarction due to unspecified occlusion or stenosis of left middle cerebral artery: Secondary | ICD-10-CM | POA: Diagnosis not present

## 2019-07-10 DIAGNOSIS — J449 Chronic obstructive pulmonary disease, unspecified: Secondary | ICD-10-CM | POA: Diagnosis not present

## 2019-07-10 LAB — CULTURE, RESPIRATORY W GRAM STAIN

## 2019-07-10 NOTE — Progress Notes (Signed)
Pulmonary Critical Care Medicine [facility] [place]   PULMONARY CRITICAL CARE SERVICE  PROGRESS NOTE  Date of Service: 07/10/2019  Garrett Zimmerman  [id number]  DOB: 02/15/1945   DOA: 05/19/2019  Referring Physician: Carron Curie, MD  HPI: Garrett Zimmerman is a 74 y.o. male seen for follow up of Acute on Chronic Respiratory Failure.  Patient is currently on 30% T collar has been doing relatively well.  Possibly going to be discharged also today  Medications: Reviewed on Rounds  Physical Exam:  Vitals: Temperature is 96.8 pulse 95 respiratory 28 blood pressure is 117/78 saturations 95%  Ventilator Settings on T collar FiO2 30%  . General: Comfortable at this time . Eyes: Grossly normal lids, irises & conjunctiva . ENT: grossly tongue is normal . Neck: no obvious mass . Cardiovascular: S1 S2 normal no gallop . Respiratory: No rhonchi no rales are noted at this time . Abdomen: soft . Skin: no rash seen on limited exam . Musculoskeletal: not rigid . Psychiatric:unable to assess . Neurologic: no seizure no involuntary movements         Lab Data:   Basic Metabolic Panel: Recent Labs  Lab 07/06/19 0627  NA 139  K 3.9  CL 100  CO2 33*  GLUCOSE 144*  BUN 24*  CREATININE 0.72  CALCIUM 10.1    ABG: No results for input(s): PHART, PCO2ART, PO2ART, HCO3, O2SAT in the last 168 hours.  Liver Function Tests: No results for input(s): AST, ALT, ALKPHOS, BILITOT, PROT, ALBUMIN in the last 168 hours. No results for input(s): LIPASE, AMYLASE in the last 168 hours. No results for input(s): AMMONIA in the last 168 hours.  CBC: Recent Labs  Lab 07/06/19 0627  WBC 7.9  HGB 9.9*  HCT 32.5*  MCV 90.8  PLT 230    Cardiac Enzymes: No results for input(s): CKTOTAL, CKMB, CKMBINDEX, TROPONINI in the last 168 hours.  BNP (last 3 results) No results for input(s): BNP in the last 8760 hours.  ProBNP (last 3 results) No results for input(s): PROBNP in the last  8760 hours.  Radiological Exams: No results found.  Assessment/Plan Active Problems:   Acute on chronic respiratory failure with hypoxia (HCC)   Ventilator associated pneumonia (HCC)   COPD, severe (HCC)   Left acute arterial ischemic stroke, MCA (middle cerebral artery) (HCC)   Cardiac arrest (HCC)   1. Acute on chronic respiratory failure hypoxia we will continue with T collar trials currently on 30% FiO2 titrate oxygen as tolerated will await word about discharge 2. Ventilator associated pneumonia treated clinically is resolved 3. Severe COPD at baseline now 4. Left-sided stroke therapy as tolerated 5. Cardiac arrest rhythm has been stable   I have personally seen and evaluated the patient, evaluated laboratory and imaging results, formulated the assessment and plan and placed orders. The Patient requires high complexity decision making with multiple systems involvement.  Rounds were done with the Respiratory Therapy Director and Staff therapists and discussed with nursing staff also.  [name], MD [facility] Pulmonary Critical Care Medicine Sleep Medicine

## 2019-07-11 NOTE — Progress Notes (Addendum)
Pulmonary Critical Care Medicine [facility] [place]   PULMONARY CRITICAL CARE SERVICE  PROGRESS NOTE  Date of Service: 07/11/2019  RAHIEM SCHELLINGER  [id number]  DOB: 1945/10/20   DOA: 05/19/2019  Referring Physician: Carron Curie, MD  HPI: Garrett Zimmerman is a 74 y.o. male seen for follow up of Acute on Chronic Respiratory Failure. Patient remains on aerosol trach collar 30% at this time plan is for discharge today satting well no distress.  Medications: Reviewed on Rounds  Physical Exam:  Vitals: .  Pulse 87 respirations 36 BP 130 over C4 O2 sat 94% temp 98.0  Ventilator Settings ATC 30%  . General: Comfortable at this time . Eyes: Grossly normal lids, irises & conjunctiva . ENT: grossly tongue is normal . Neck: no obvious mass . Cardiovascular: S1 S2 normal no gallop . Respiratory: No rales or ronchi noted . Abdomen: soft . Skin: no rash seen on limited exam . Musculoskeletal: not rigid . Psychiatric:unable to assess . Neurologic: no seizure no involuntary movements         Lab Data:   Basic Metabolic Panel: Recent Labs  Lab 07/06/19 0627  NA 139  K 3.9  CL 100  CO2 33*  GLUCOSE 144*  BUN 24*  CREATININE 0.72  CALCIUM 10.1    ABG: No results for input(s): PHART, PCO2ART, PO2ART, HCO3, O2SAT in the last 168 hours.  Liver Function Tests: No results for input(s): AST, ALT, ALKPHOS, BILITOT, PROT, ALBUMIN in the last 168 hours. No results for input(s): LIPASE, AMYLASE in the last 168 hours. No results for input(s): AMMONIA in the last 168 hours.  CBC: Recent Labs  Lab 07/06/19 0627  WBC 7.9  HGB 9.9*  HCT 32.5*  MCV 90.8  PLT 230    Cardiac Enzymes: No results for input(s): CKTOTAL, CKMB, CKMBINDEX, TROPONINI in the last 168 hours.  BNP (last 3 results) No results for input(s): BNP in the last 8760 hours.  ProBNP (last 3 results) No results for input(s): PROBNP in the last 8760 hours.  Radiological Exams: No results  found.  Assessment/Plan Active Problems:   Acute on chronic respiratory failure with hypoxia (HCC)   Ventilator associated pneumonia (HCC)   COPD, severe (HCC)   Left acute arterial ischemic stroke, MCA (middle cerebral artery) (HCC)   Cardiac arrest (HCC)   1. Acute on chronic respiratory failure hypoxia we will continue with T collar trials currently on 30% FiO2 titrate oxygen as tolerated will await word about discharge 2. Ventilator associated pneumonia treated clinically is resolved 3. Severe COPD at baseline now 4. Left-sided stroke therapy as tolerated 5. Cardiac arrest rhythm has been stable   I have personally seen and evaluated the patient, evaluated laboratory and imaging results, formulated the assessment and plan and placed orders. The Patient requires high complexity decision making with multiple systems involvement.  Rounds were done with the Respiratory Therapy Director and Staff therapists and discussed with nursing staff also.  [name], MD [facility] Pulmonary Critical Care Medicine Sleep Medicine

## 2019-07-12 DIAGNOSIS — J9621 Acute and chronic respiratory failure with hypoxia: Secondary | ICD-10-CM | POA: Diagnosis not present

## 2019-07-12 DIAGNOSIS — J449 Chronic obstructive pulmonary disease, unspecified: Secondary | ICD-10-CM | POA: Diagnosis not present

## 2019-07-12 DIAGNOSIS — I469 Cardiac arrest, cause unspecified: Secondary | ICD-10-CM | POA: Diagnosis not present

## 2019-07-12 DIAGNOSIS — I63512 Cerebral infarction due to unspecified occlusion or stenosis of left middle cerebral artery: Secondary | ICD-10-CM | POA: Diagnosis not present

## 2019-07-12 NOTE — Progress Notes (Signed)
Pulmonary Critical Care Medicine [facility] [place]   PULMONARY CRITICAL CARE SERVICE  PROGRESS NOTE  Date of Service: 07/12/2019  Garrett Zimmerman  [id number]  DOB: Jun 12, 1945   DOA: 05/19/2019  Referring Physician: Carron Curie, MD  HPI: Garrett Zimmerman is a 74 y.o. male seen for follow up of Acute on Chronic Respiratory Failure.  Patient is on T collar currently on 35% FiO2 with good saturations  Medications: Reviewed on Rounds  Physical Exam:  Vitals: Temperature is 98.6 pulse 108 respiratory 26 blood pressure is 125/76 saturations 98%  Ventilator Settings on T collar on FiO2 of 35%  . General: Comfortable at this time . Eyes: Grossly normal lids, irises & conjunctiva . ENT: grossly tongue is normal . Neck: no obvious mass . Cardiovascular: S1 S2 normal no gallop . Respiratory: No rhonchi no rales are noted at this time . Abdomen: soft . Skin: no rash seen on limited exam . Musculoskeletal: not rigid . Psychiatric:unable to assess . Neurologic: no seizure no involuntary movements         Lab Data:   Basic Metabolic Panel: Recent Labs  Lab 07/06/19 0627  NA 139  K 3.9  CL 100  CO2 33*  GLUCOSE 144*  BUN 24*  CREATININE 0.72  CALCIUM 10.1    ABG: No results for input(s): PHART, PCO2ART, PO2ART, HCO3, O2SAT in the last 168 hours.  Liver Function Tests: No results for input(s): AST, ALT, ALKPHOS, BILITOT, PROT, ALBUMIN in the last 168 hours. No results for input(s): LIPASE, AMYLASE in the last 168 hours. No results for input(s): AMMONIA in the last 168 hours.  CBC: Recent Labs  Lab 07/06/19 0627  WBC 7.9  HGB 9.9*  HCT 32.5*  MCV 90.8  PLT 230    Cardiac Enzymes: No results for input(s): CKTOTAL, CKMB, CKMBINDEX, TROPONINI in the last 168 hours.  BNP (last 3 results) No results for input(s): BNP in the last 8760 hours.  ProBNP (last 3 results) No results for input(s): PROBNP in the last 8760 hours.  Radiological Exams: No  results found.  Assessment/Plan Active Problems:   Acute on chronic respiratory failure with hypoxia (HCC)   Ventilator associated pneumonia (HCC)   COPD, severe (HCC)   Left acute arterial ischemic stroke, MCA (middle cerebral artery) (HCC)   Cardiac arrest (HCC)   1. Acute on chronic respiratory failure hypoxia plan is to continue with T collar currently on 35% FiO2 2. Ventilator associated pneumonia treated we will follow along 3. Severe COPD at baseline continue with present management 4. Left acute side stroke we will continue present therapy 5. Cardiac arrest rhythm is stable   I have personally seen and evaluated the patient, evaluated laboratory and imaging results, formulated the assessment and plan and placed orders. The Patient requires high complexity decision making with multiple systems involvement.  Rounds were done with the Respiratory Therapy Director and Staff therapists and discussed with nursing staff also.  [name], MD [facility] Pulmonary Critical Care Medicine Sleep Medicine

## 2019-07-13 DIAGNOSIS — I469 Cardiac arrest, cause unspecified: Secondary | ICD-10-CM | POA: Diagnosis not present

## 2019-07-13 DIAGNOSIS — J449 Chronic obstructive pulmonary disease, unspecified: Secondary | ICD-10-CM | POA: Diagnosis not present

## 2019-07-13 DIAGNOSIS — I63512 Cerebral infarction due to unspecified occlusion or stenosis of left middle cerebral artery: Secondary | ICD-10-CM | POA: Diagnosis not present

## 2019-07-13 DIAGNOSIS — J9621 Acute and chronic respiratory failure with hypoxia: Secondary | ICD-10-CM | POA: Diagnosis not present

## 2019-07-13 NOTE — Progress Notes (Signed)
Pulmonary Critical Care Medicine [facility] [place]   PULMONARY CRITICAL CARE SERVICE  PROGRESS NOTE  Date of Service: 07/13/2019  Garrett Zimmerman  [id number]  DOB: 03/16/1945   DOA: 05/19/2019  Referring Physician: Carron Curie, MD  HPI: Garrett Zimmerman is a 74 y.o. male seen for follow up of Acute on Chronic Respiratory Failure.  Patient currently is off the ventilator on T collar on 35% FiO2 good saturations  Medications: Reviewed on Rounds  Physical Exam:  Vitals: Temperature is 97.9 pulse 89 respiratory rate 30 blood pressure is 174/81 saturations 94%  Ventilator Settings on T collar FiO2 35%  . General: Comfortable at this time . Eyes: Grossly normal lids, irises & conjunctiva . ENT: grossly tongue is normal . Neck: no obvious mass . Cardiovascular: S1 S2 normal no gallop . Respiratory: Scattered rhonchi coarse breath sounds . Abdomen: soft . Skin: no rash seen on limited exam . Musculoskeletal: not rigid . Psychiatric:unable to assess . Neurologic: no seizure no involuntary movements         Lab Data:   Basic Metabolic Panel: No results for input(s): NA, K, CL, CO2, GLUCOSE, BUN, CREATININE, CALCIUM, MG, PHOS in the last 168 hours.  ABG: No results for input(s): PHART, PCO2ART, PO2ART, HCO3, O2SAT in the last 168 hours.  Liver Function Tests: No results for input(s): AST, ALT, ALKPHOS, BILITOT, PROT, ALBUMIN in the last 168 hours. No results for input(s): LIPASE, AMYLASE in the last 168 hours. No results for input(s): AMMONIA in the last 168 hours.  CBC: No results for input(s): WBC, NEUTROABS, HGB, HCT, MCV, PLT in the last 168 hours.  Cardiac Enzymes: No results for input(s): CKTOTAL, CKMB, CKMBINDEX, TROPONINI in the last 168 hours.  BNP (last 3 results) No results for input(s): BNP in the last 8760 hours.  ProBNP (last 3 results) No results for input(s): PROBNP in the last 8760 hours.  Radiological Exams: No results  found.  Assessment/Plan Active Problems:   Acute on chronic respiratory failure with hypoxia (HCC)   Ventilator associated pneumonia (HCC)   COPD, severe (HCC)   Left acute arterial ischemic stroke, MCA (middle cerebral artery) (HCC)   Cardiac arrest (HCC)   1. Acute on chronic respiratory failure hypoxia we will continue with T collar trials continue pulmonary toilet supportive care 2. Ventilator associated pneumonia treated resolved 3. Severe COPD at baseline 4. Left-sided stroke therapy as tolerated 5. Cardiac arrest rhythm is stable   I have personally seen and evaluated the patient, evaluated laboratory and imaging results, formulated the assessment and plan and placed orders. The Patient requires high complexity decision making with multiple systems involvement.  Rounds were done with the Respiratory Therapy Director and Staff therapists and discussed with nursing staff also.  [name], MD [facility] Pulmonary Critical Care Medicine Sleep Medicine

## 2019-07-14 LAB — VANCOMYCIN, TROUGH: Vancomycin Tr: 17 ug/mL (ref 15–20)

## 2020-03-09 DEATH — deceased

## 2021-06-30 IMAGING — DX DG HUMERUS 2V *L*
2 series · 3 of 3 positions shown · non-contrast
Comparison: Portable chest radiographs 06/12/2019 and earlier.

CLINICAL DATA: 73-year-old male with left arm stiffness and
weakness.

EXAM:
LEFT HUMERUS - 2+ VIEW

[Series 1: humerus ap · 0.14mm/px · 2 of 2 slices shown]
[im 1/2]
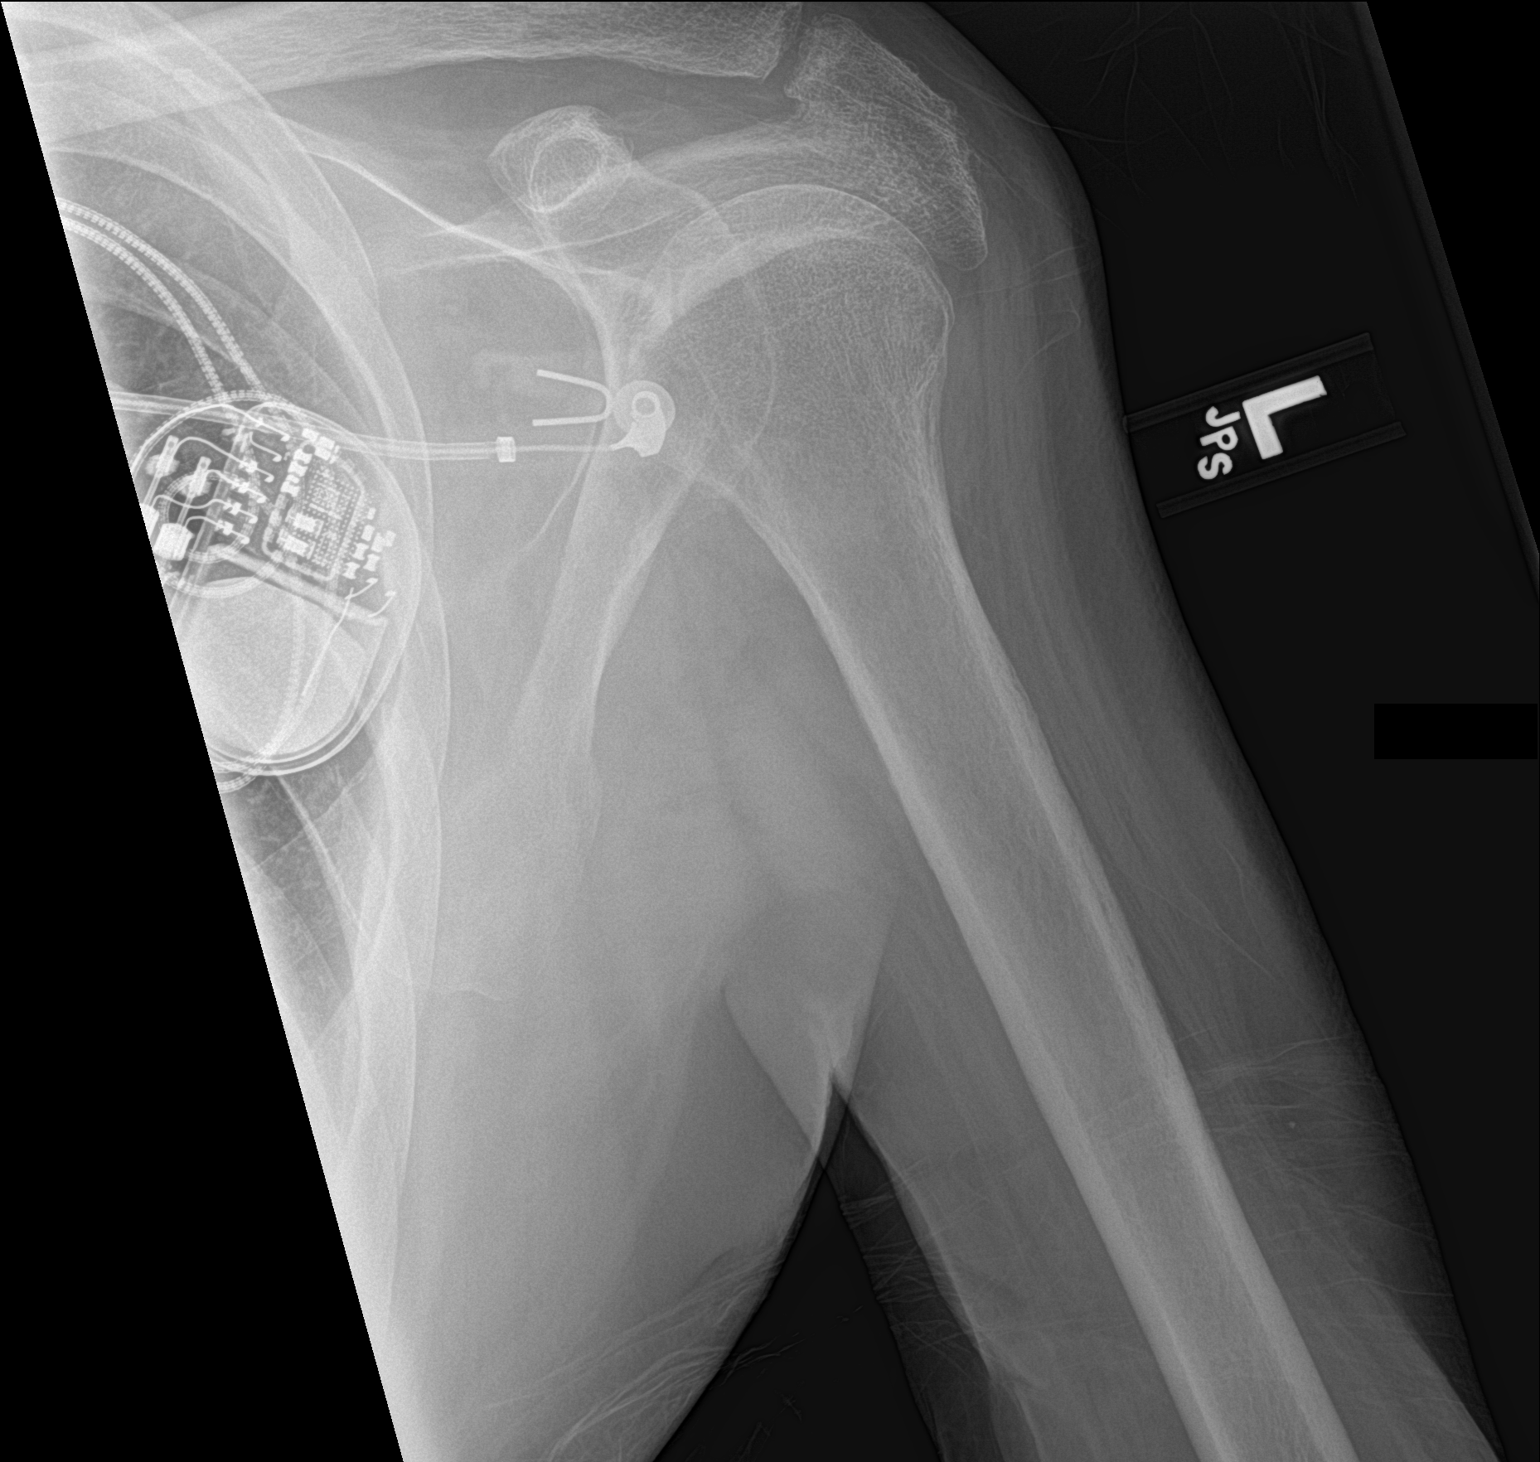
[im 2/2]
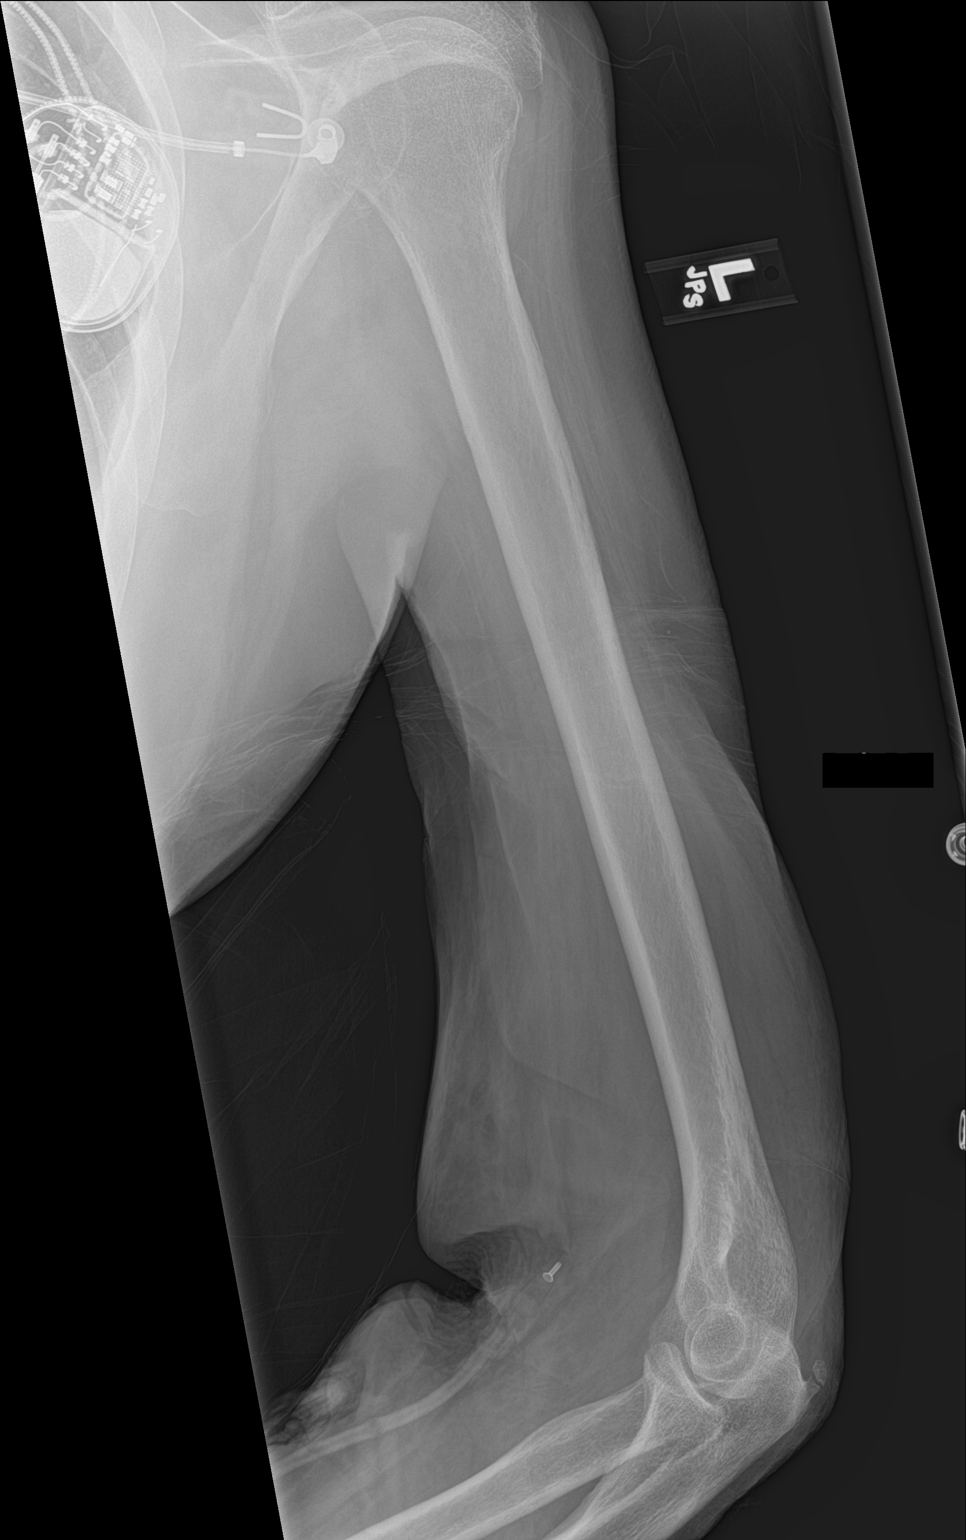

[humerus lat]
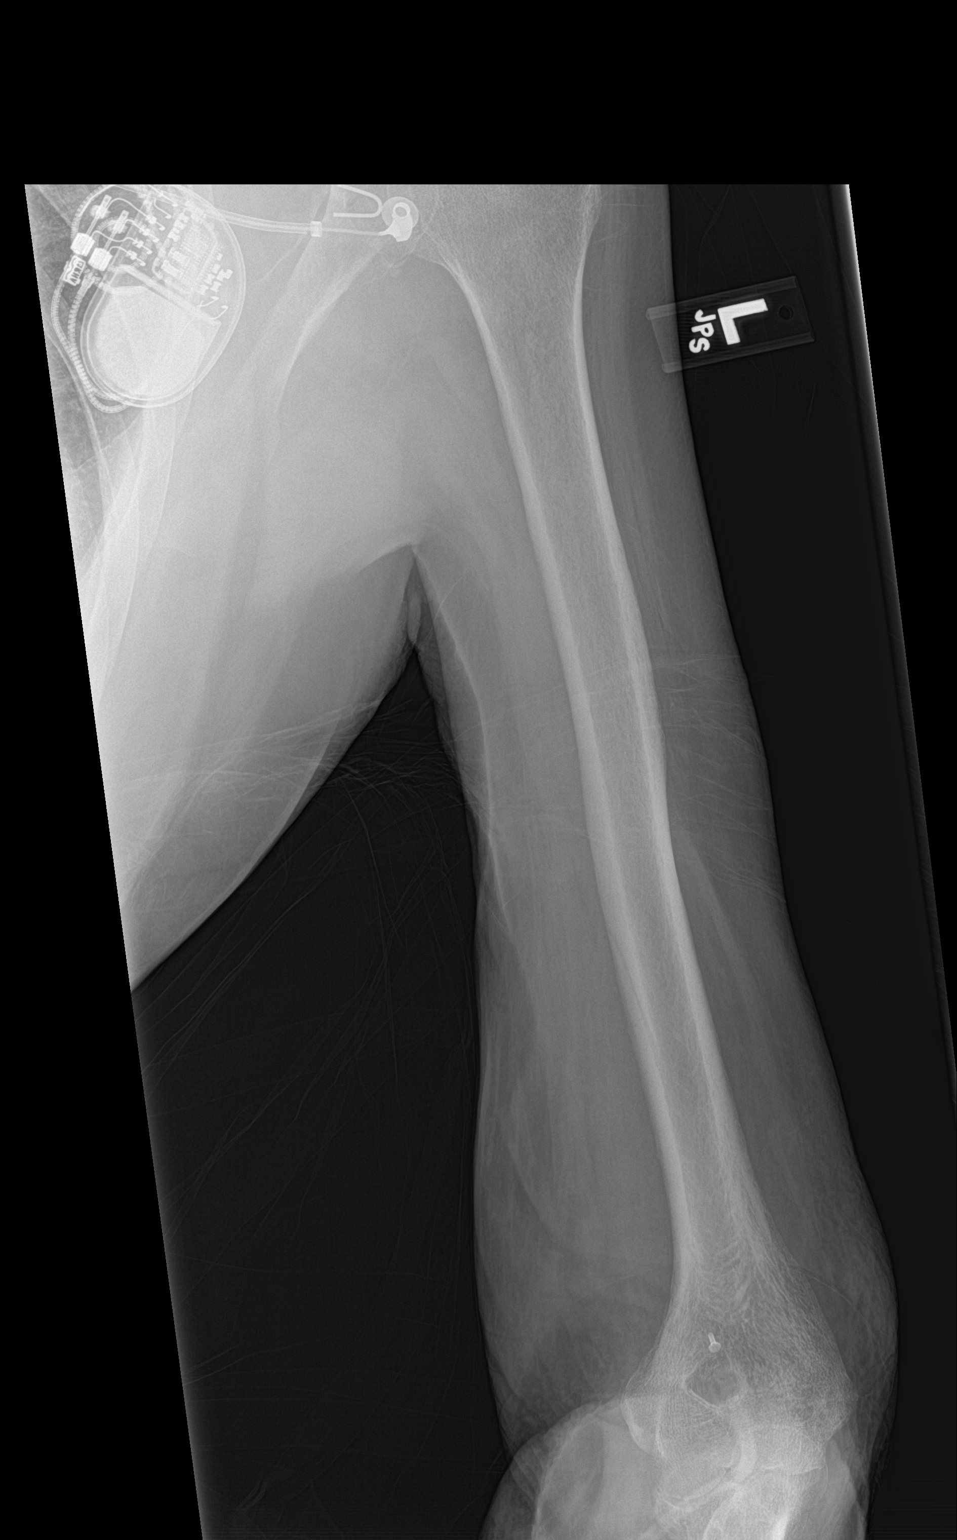

[3 of 3 positions shown; findings below may reference images not displayed]

FINDINGS: Portable AP and lateral views of the left humerus. Left chest
cardiac pacemaker again. IV access at the left antecubital fossa.
Bone mineralization is within normal limits for age. The left
humerus appears intact with grossly normal alignment at the left
shoulder and elbow.

Visible left shoulder osseous structures appear grossly intact.

There is a small ossific fragment at the tip of a degenerative
proximal ulna osteophyte. But no definite regional soft tissue
swelling.
IMPRESSION: 1. Negative left humerus.
2. Cannot exclude small recent fracture at the tip of a degenerative
osteophyte off the proximal ulna.

## 2021-07-03 IMAGING — DX DG CHEST 1V PORT
1 series · 1 of 1 positions shown · non-contrast
Comparison: 06/16/2019

CLINICAL DATA: Chest tube.  Empyema.

EXAM:
PORTABLE CHEST 1 VIEW

[chest]
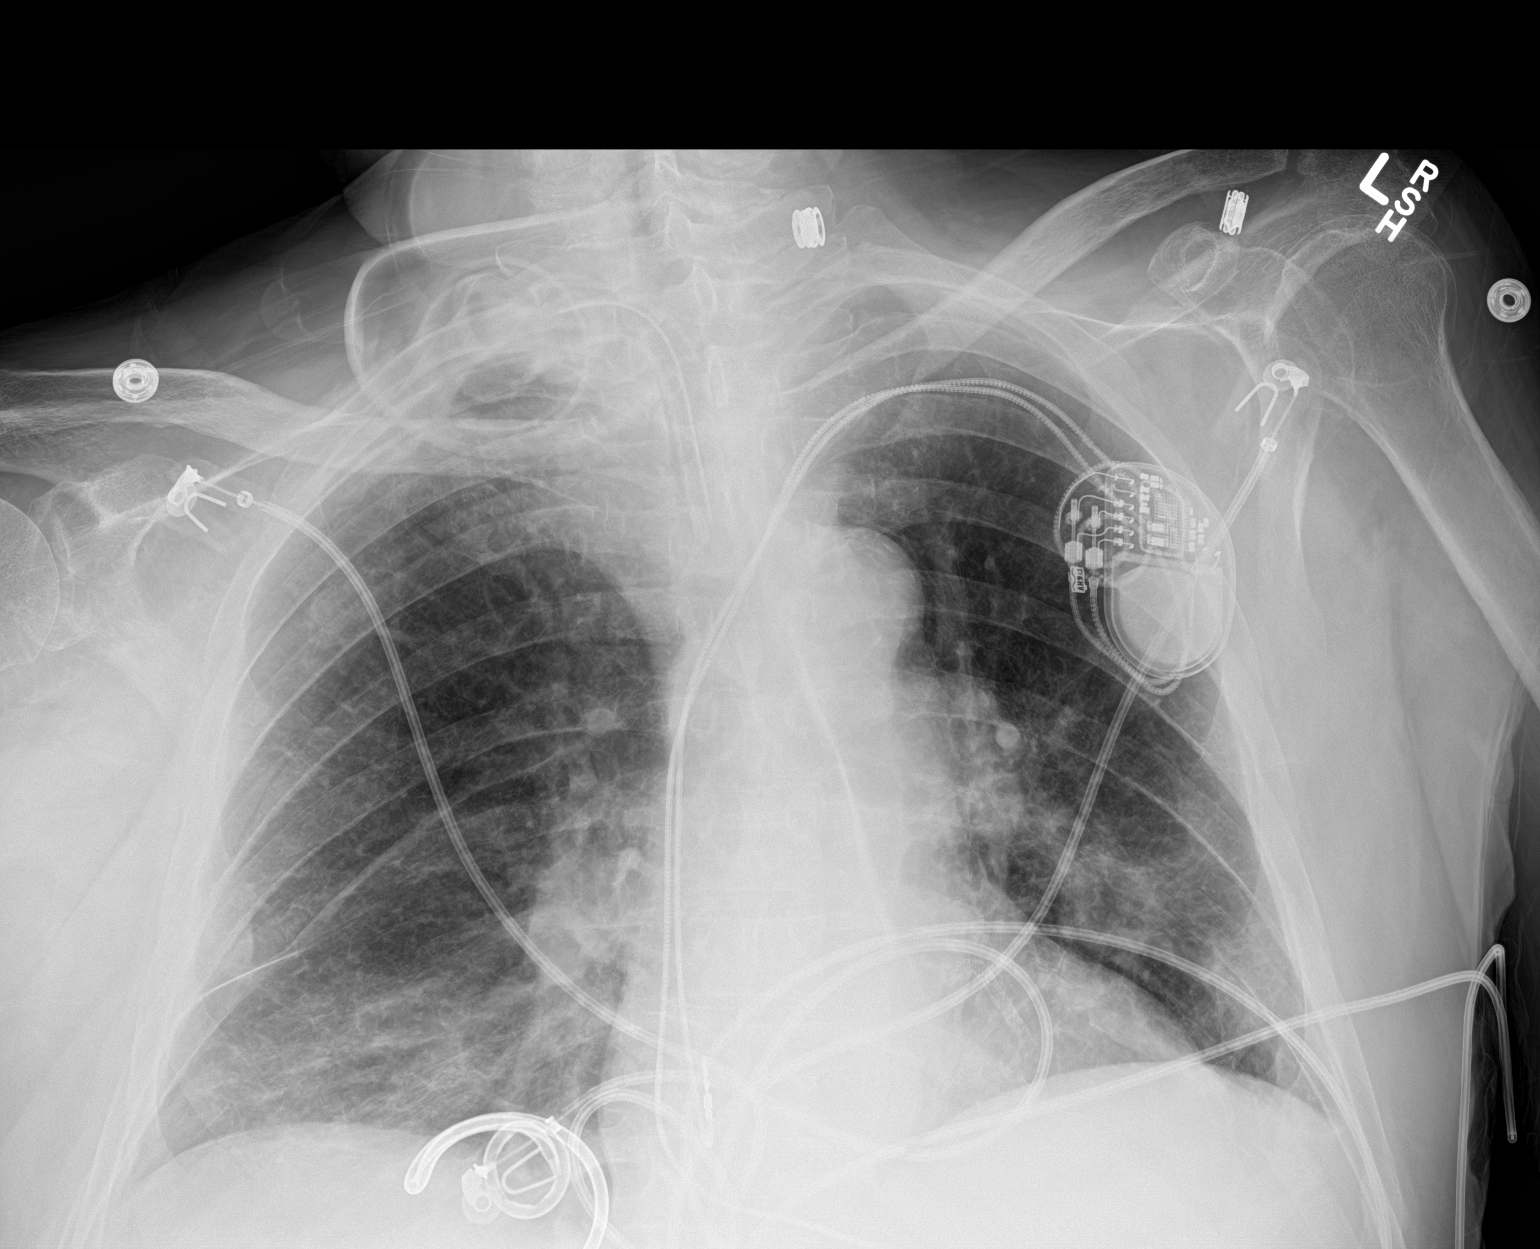

[1 of 1 positions shown; findings below may reference images not displayed]

FINDINGS: Tracheostomy as seen previously. Right pleural catheter at the
inferior pleural space. No pneumothorax. Mild bibasilar atelectasis
persists. Dual lead pacemaker. Coronary artery stent. Multiple leads
overlie the chest.
IMPRESSION: No apparent change since study of 3 days ago. No right pneumothorax.
Mild bibasilar atelectasis.

## 2021-07-04 IMAGING — DX DG CHEST 1V PORT
2 series · 2 of 2 positions shown · non-contrast
Comparison: 06/19/2019

CLINICAL DATA: Pneumothorax

EXAM:
PORTABLE CHEST 1 VIEW

[chest ap (1 of 2)]
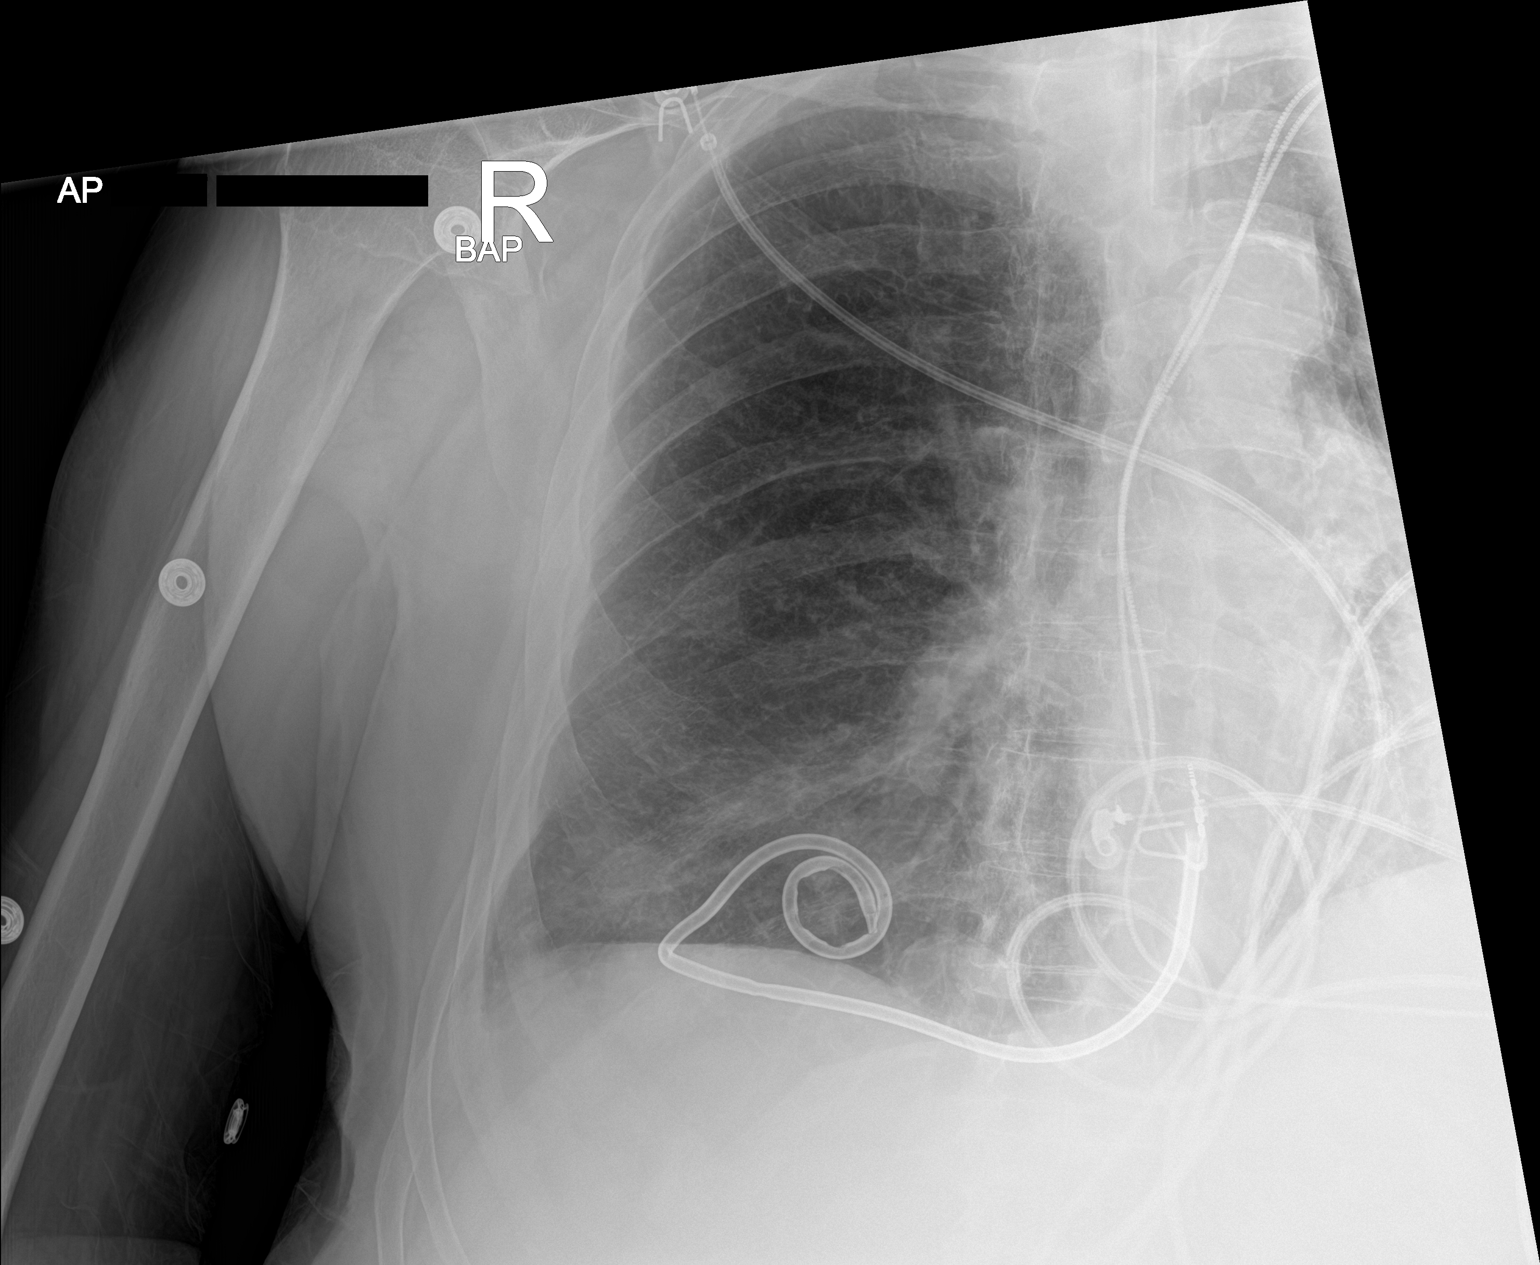

[chest ap (2 of 2)]
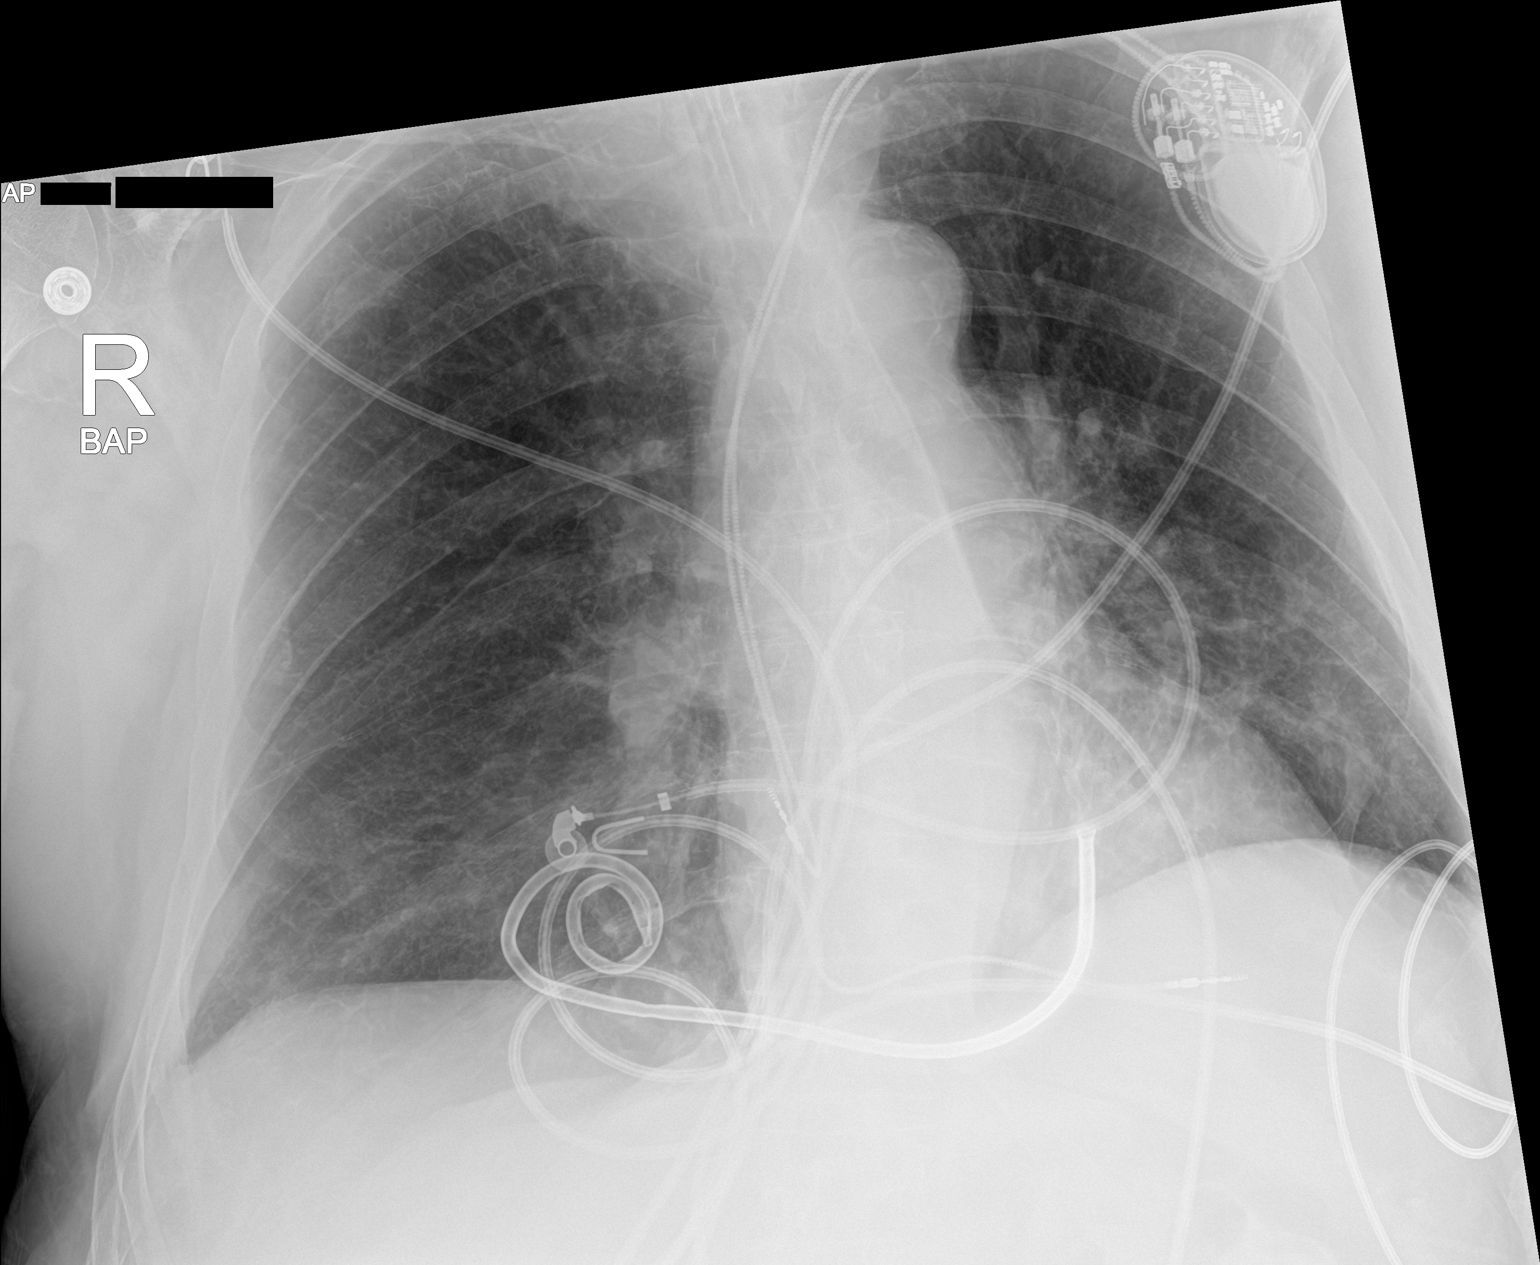

[2 of 2 positions shown; findings below may reference images not displayed]

FINDINGS: Right basilar chest tube in place. No visible pneumothorax.
Tracheostomy and left pacer are unchanged. Heart is normal size.
Patchy bilateral lower lobe airspace opacities, slightly improved on
the right since prior study.
IMPRESSION: Right basilar chest tube.  No visible pneumothorax.

Bibasilar airspace opacities, improving on the right.

## 2021-07-08 IMAGING — DX DG CHEST 1V PORT
1 series · 1 of 1 positions shown · non-contrast
Comparison: Earlier film of the same day

CLINICAL DATA: Post right side chest tube removal.

EXAM:
PORTABLE CHEST - 1 VIEW

[chest]
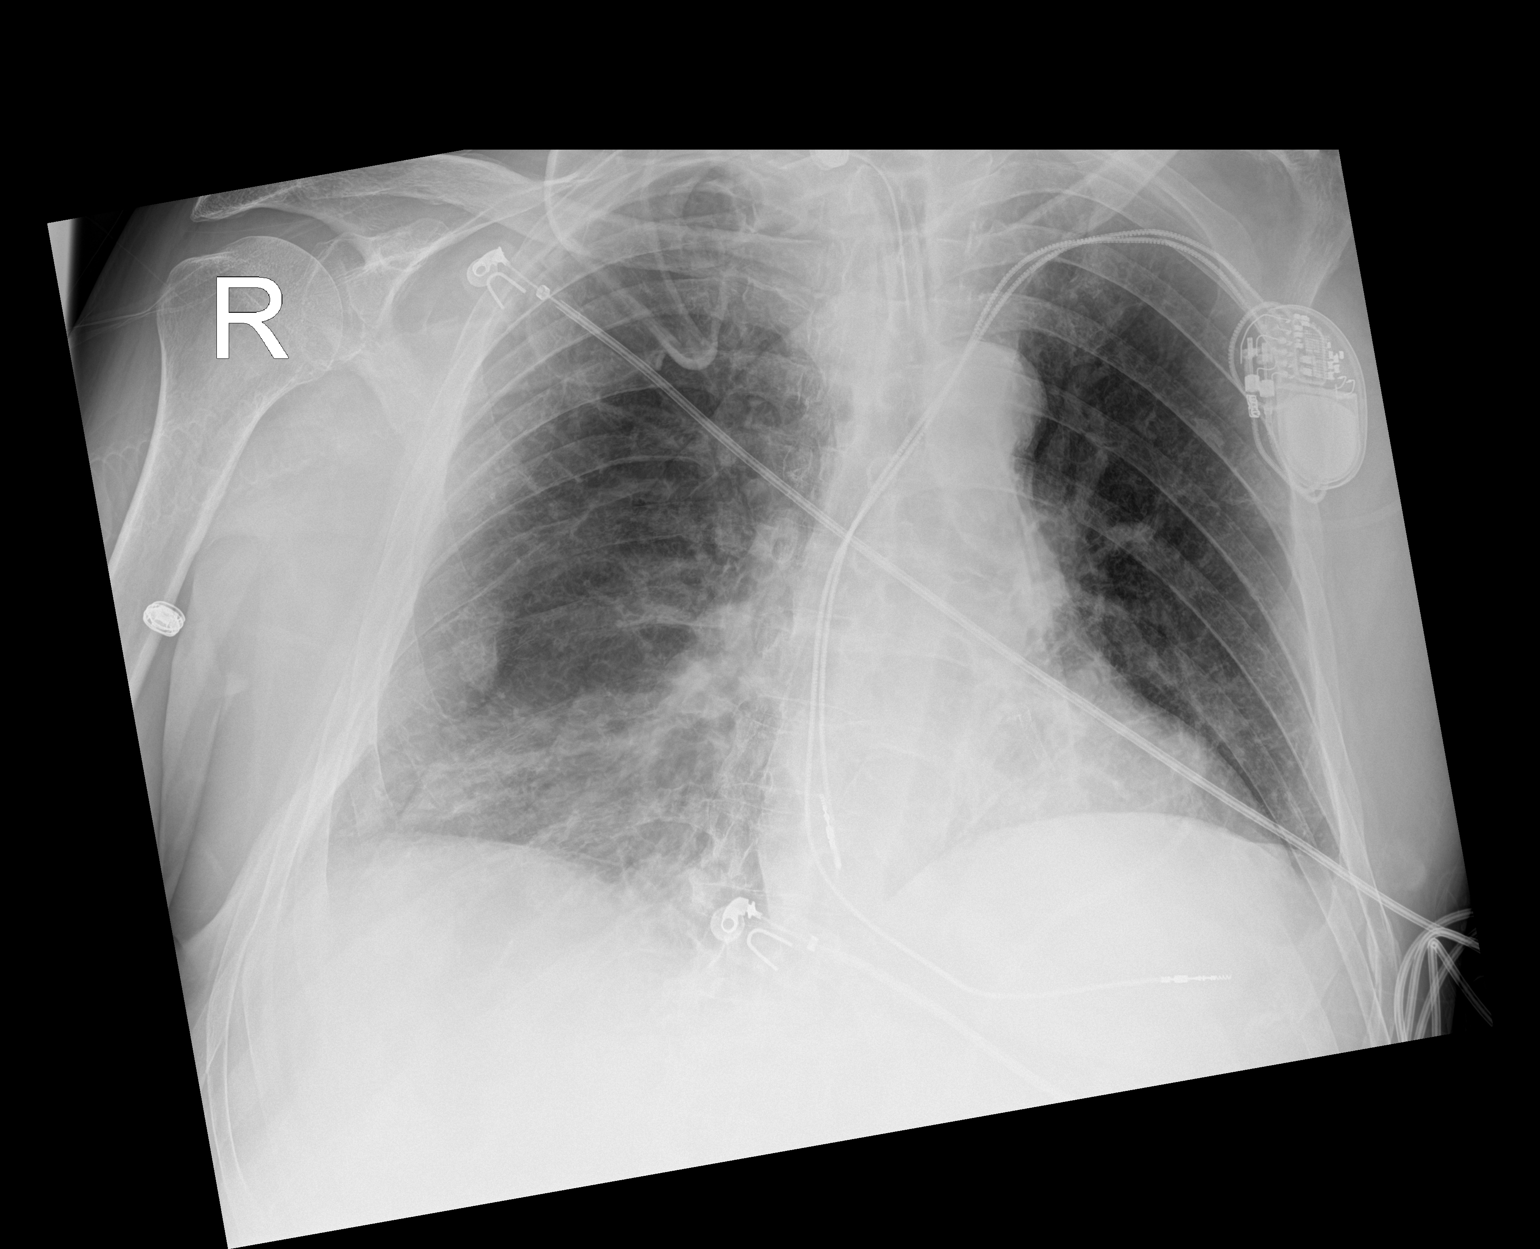

[1 of 1 positions shown; findings below may reference images not displayed]

FINDINGS: Interval removal of pigtail catheter previously seen at the right
lung base. No pneumothorax. Tracheostomy device and left subclavian
dual lead transvenous pacemaker stable. Relatively low lung volumes
with some linear scarring or atelectasis in the bases right greater
than left, slightly increased.

Heart size and mediastinal contours are within normal limits.

No effusion.

Visualized bones unremarkable.
IMPRESSION: No pneumothorax post right chest tube removal.
